# Patient Record
Sex: Male | Born: 1975 | Race: White | Hispanic: No | Marital: Single | Smoking: Current every day smoker
Health system: Southern US, Community
[De-identification: ages and names within clinical notes are randomized; demographics above are authoritative.]

## PROBLEM LIST (undated history)

## (undated) DIAGNOSIS — F1910 Other psychoactive substance abuse, uncomplicated: Secondary | ICD-10-CM

---

## 2019-08-03 ENCOUNTER — Emergency Department (HOSPITAL_COMMUNITY)
Admission: EM | Admit: 2019-08-03 | Discharge: 2019-08-03 | Disposition: A | Discharge status: Home / Self Care | Payer: Self-pay | Attending: Emergency Medicine | Admitting: Emergency Medicine

## 2019-08-03 ENCOUNTER — Other Ambulatory Visit: Payer: Self-pay

## 2019-08-03 ENCOUNTER — Encounter (HOSPITAL_COMMUNITY): Payer: Self-pay | Admitting: Family Medicine

## 2019-08-03 DIAGNOSIS — F1510 Other stimulant abuse, uncomplicated: Secondary | ICD-10-CM

## 2019-08-03 DIAGNOSIS — F1220 Cannabis dependence, uncomplicated: Secondary | ICD-10-CM | POA: Insufficient documentation

## 2019-08-03 DIAGNOSIS — F32A Depression, unspecified: Secondary | ICD-10-CM

## 2019-08-03 DIAGNOSIS — Z8659 Personal history of other mental and behavioral disorders: Secondary | ICD-10-CM

## 2019-08-03 DIAGNOSIS — F1520 Other stimulant dependence, uncomplicated: Secondary | ICD-10-CM

## 2019-08-03 DIAGNOSIS — F329 Major depressive disorder, single episode, unspecified: Secondary | ICD-10-CM

## 2019-08-03 LAB — CBC
HCT: 53.5 % — ABNORMAL HIGH (ref 39.0–52.0)
Hemoglobin: 17.7 g/dL — ABNORMAL HIGH (ref 13.0–17.0)
MCH: 28.5 pg (ref 26.0–34.0)
MCHC: 33.1 g/dL (ref 30.0–36.0)
MCV: 86.2 fL (ref 80.0–100.0)
Platelets: 317 10*3/uL (ref 150–400)
RBC: 6.21 MIL/uL — ABNORMAL HIGH (ref 4.22–5.81)
RDW: 13.2 % (ref 11.5–15.5)
WBC: 9.8 10*3/uL (ref 4.0–10.5)
nRBC: 0 % (ref 0.0–0.2)

## 2019-08-03 LAB — RAPID URINE DRUG SCREEN, HOSP PERFORMED
Amphetamines: POSITIVE — AB
Barbiturates: NOT DETECTED
Benzodiazepines: NOT DETECTED
Cocaine: NOT DETECTED
Opiates: NOT DETECTED
Tetrahydrocannabinol: POSITIVE — AB

## 2019-08-03 LAB — COMPREHENSIVE METABOLIC PANEL
ALT: 40 U/L (ref 0–44)
AST: 28 U/L (ref 15–41)
Albumin: 5.4 g/dL — ABNORMAL HIGH (ref 3.5–5.0)
Alkaline Phosphatase: 66 U/L (ref 38–126)
Anion gap: 15 (ref 5–15)
BUN: 11 mg/dL (ref 6–20)
CO2: 23 mmol/L (ref 22–32)
Calcium: 10.4 mg/dL — ABNORMAL HIGH (ref 8.9–10.3)
Chloride: 100 mmol/L (ref 98–111)
Creatinine, Ser: 1.29 mg/dL — ABNORMAL HIGH (ref 0.61–1.24)
GFR calc Af Amer: >60 mL/min (ref >60)
GFR calc non Af Amer: >60 mL/min (ref >60)
Glucose, Bld: 100 mg/dL — ABNORMAL HIGH (ref 70–99)
Potassium: 3.9 mmol/L (ref 3.5–5.1)
Sodium: 138 mmol/L (ref 135–145)
Total Bilirubin: 1.4 mg/dL — ABNORMAL HIGH (ref 0.3–1.2)
Total Protein: 8.3 g/dL — ABNORMAL HIGH (ref 6.5–8.1)

## 2019-08-03 LAB — SALICYLATE LEVEL: Salicylate Lvl: <7 mg/dL (ref 2.8–30.0)

## 2019-08-03 LAB — ETHANOL: Alcohol, Ethyl (B): <10 mg/dL (ref <10)

## 2019-08-03 LAB — ACETAMINOPHEN LEVEL: Acetaminophen (Tylenol), Serum: <10 ug/mL — ABNORMAL LOW (ref 10–30)

## 2019-08-03 NOTE — ED Notes (Signed)
Pt's significant other is in the room with pt. Per previous shift, Dr Maudie Mercury wants pt's girlfriend to stay in the room with pt the entire time to keep him calm. Charge nurse is aware.

## 2019-08-03 NOTE — ED Triage Notes (Signed)
Patient states he is here to "get off of meth". Patient states he has been using for years and last time he smoked methamphetamine was yesterday. Patient denies pain. Patient breathing very rapidly. States he thinks his girlfriend is going to leave him and he "always gets like this" when he thinks she will leave him. Patient states he has had thoughts of harming himself in past two weeks when his girlfriend left him. No specific plan in place.

## 2019-08-03 NOTE — ED Provider Notes (Signed)
[facility]-EMERGENCY DEPT Provider Note   CSN: [id number] Arrival date & time: 08/03/19  1750     History   Chief Complaint Chief Complaint  Patient presents with  . Withdrawal    HPI Daniel Hutchinson is a 43 y.o. male with past medical hx s/f long term methamphetamine use (22 years), who presents to the ED as directed by [facility] emergency services.  Patient reports that he has never fully withdrawn from methamphetamines.  He is only felt acute withdrawal effects at which point he will use again.  Patient has many psychosocial stressors at this time, including, fear of his girlfriend leaving him.  His girlfriend is at bedside.  He has expressed suicidal ideation in the past and fear of girlfriend leaving him.  He does not currently have SI, HI, AVS.  Allergies & Medications Patient has no known allergies. Prior to Admission medications   Not on File    Past Medical/Surgical History History reviewed. No pertinent past medical history. Patient Active Problem List   Diagnosis Date Noted  . 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 Depression 08/03/2019  . History of suicidal ideation 08/03/2019  . Methamphetamine dependence, continuous (HCC) 08/03/2019   History reviewed. No pertinent surgical history.   Family History History reviewed. No pertinent family history.  Social History Social History   Tobacco Use  . Smoking status: Current Every Day Smoker    Types: Cigarettes  . Smokeless tobacco: Never Used  Substance Use Topics  . Alcohol use: Not Currently  . Drug use: Yes    Types: IV, Methamphetamines    Review of Systems Review of Systems  Constitutional: Positive for diaphoresis. Negative for chills and fever.  HENT: Negative for congestion, ear pain, sneezing and sore throat.   Eyes: Negative for pain and visual disturbance.  Respiratory: Negative for cough and shortness of breath.   Cardiovascular: Negative for chest pain and  palpitations.  Gastrointestinal: Negative for abdominal pain and vomiting.  Endocrine: Positive for heat intolerance.  Genitourinary: Negative for dysuria and hematuria.  Musculoskeletal: Negative for arthralgias and back pain.  Skin: Negative for color change and rash.  Neurological: Negative for seizures and syncope.  Psychiatric/Behavioral: Positive for suicidal ideas. The patient is nervous/anxious.   All other systems reviewed and are negative.   Physical Exam Updated Vital Signs BP (!) 139/105 (BP Location: Left Arm)   Pulse 100   Temp 97.6 F (36.4 C) (Oral)   Resp (!) 100   Ht 6' (1.829 m)   Wt 90.7 kg   SpO2 100%   BMI 27.12 kg/m   Physical Exam Vitals signs and nursing note reviewed.  Constitutional:      Appearance: He is well-developed.  HENT:     Head: Normocephalic and atraumatic.  Eyes:     Conjunctiva/sclera: Conjunctivae normal.  Neck:     Musculoskeletal: Normal range of motion and neck supple.  Cardiovascular:     Rate and Rhythm: Normal rate and regular rhythm.     Heart sounds: No murmur.  Pulmonary:     Effort: Pulmonary effort is normal. No respiratory distress.     Breath sounds: Normal breath sounds.  Abdominal:     Palpations: Abdomen is soft.     Tenderness: There is no abdominal tenderness.  Skin:    General: Skin is warm and dry.     Capillary Refill: Capillary refill takes less than 2 seconds.  Neurological:     Mental Status: He is alert.  Psychiatric:        Attention and Perception: Attention and perception normal. He does not perceive auditory or visual hallucinations.        Mood and Affect: Mood is anxious. Affect is tearful.        Speech: Speech normal.        Behavior: Behavior is cooperative.        Thought Content: Thought content does not include suicidal ideation.        Cognition and Memory: Cognition and memory normal.     ED Treatments / Results  Labs (all labs ordered are listed, but only abnormal results are  displayed) Labs Reviewed  COMPREHENSIVE METABOLIC PANEL - Abnormal; Notable for the following components:      Result Value   Glucose, Bld 100 (*)    Creatinine, Ser 1.29 (*)    Calcium 10.4 (*)    Total Protein 8.3 (*)    Albumin 5.4 (*)    Total Bilirubin 1.4 (*)    All other components within normal limits  ACETAMINOPHEN LEVEL - Abnormal; Notable for the following components:   Acetaminophen (Tylenol), Serum <10 (*)    All other components within normal limits  CBC - Abnormal; Notable for the following components:   RBC 6.21 (*)    Hemoglobin 17.7 (*)    HCT 53.5 (*)    All other components within normal limits  RAPID URINE DRUG SCREEN, HOSP PERFORMED - Abnormal; Notable for the following components:   Amphetamines POSITIVE (*)    Tetrahydrocannabinol POSITIVE (*)    All other components within normal limits  ETHANOL  SALICYLATE LEVEL    EKG None  Radiology No results found.  Procedures Procedures (including critical care time)  Medications Ordered in ED Medications - No data to display  Initial Impression / Assessment and Plan / ED Course  I have reviewed the triage vital signs and the nursing notes. Pertinent labs & imaging results that were available during my care of the patient were reviewed by me and considered in my medical decision making (see chart for details). Patient was placed in TCU as there were some reports of patient being suicidal.  Patient denies being actively suicidal at this time but does want to detox from methamphetamines.  He does not want to receive any benzodiazepines, opiates, or any other addicting substances. Unclear disposition at this time as methamphetamine withdrawal does not require hospitalization typically and patient does not have active suicidal ideation.  However, with withdrawal, patient is at high risk for depression, anxiety, SI, which patient's girlfriend endorses that he has done in the past.  Patient had an episode when he  thought the girlfriend was not going to be able to stay in the room with him, he hyperventilated, became tachycardic per nursing.  Will consult psych for recommendations on further management and dispo.  Continue to monitor patient and vital signs routinely.  Patient seen by behavioral health and they recommend peer support consult prior to discharge.  He is currently not actively suicidal and does not meet any requirements for behavioral health admission.   Provided patient with information about substance abuse programs in the area.       Final Clinical Impressions(s) / ED Diagnoses   Final diagnoses:  Methamphetamine abuse (HCC)  Methamphetamine dependence, continuous (HCC)  History of suicidal ideation    Disposition: Home  ED Discharge Orders    None     Melene Planachel E. Berish Bohman, M.D. FM PGY-2  Wilber Oliphant, MD 08/03/19 2213    Margette Fast, MD 08/04/19 1141
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
## 2019-08-03 NOTE — ED Notes (Signed)
Pt aware urine sample is needed and pt needs to be changed into scrubs.

## 2019-08-03 NOTE — Discharge Instructions (Addendum)
We have called a referral for peer support program to help you through quitting methamphetamines.  Please come back if you need anything at all or if you are experiencing any further suicidal ideation.  I wish you the best and I am glad that you have decided to come off of methamphetamines.  Take care.  Substance Abuse Treatment Programs  Intensive Outpatient Programs [facility]     [address]      [place], [place]                   [phone]       [facility] [address] [place] [address] [place], [place] [phone]  [facility]     (Inpatient and outpatient)     [address]           [phone]    [facility] [phone] (Suboxone and Methadone)  [address]      [place], [place] [address]      [phone]       [address] [place], [place] [phone]  [facility] (Outpatient/Inpatient, [employer])    (insurance only) [phone]             [facility] (Groups & Residential) [place], [place] [phone]     [facility]     [address]     [place], [place]      [phone]       [facility] (for caregivers and family) [phone] [address] [place], [place] [phone]      Residential Treatment Programs [facility]      [address] [place], [place] [address]  [phone]       [facility] [address], [place], [place] [address] [phone]  [facility] [place]        [phone]       [facility] [phone]  [facility] ([facility]             [address]                                         [place], [place]                                                [phone] or [phone]                               [facility] [place] [address] [place], [address] [phone]  [facility]    [address]      [place],  [place]     [phone]       [facility] [address] [place], [place] [phone]  [facility]   [address] [place], [place] [address]     [phone]      Admissions: 8am-3pm M-F  [facility] ([facility]) [address] [place], [place] [phone]  [facility]: Residential Program [phone] Days)   [place], [place]      [phone] or [phone]     [facility]: [facility]  [facility] [place], [place] (Walk in Hours over the weekend or by referral)  [facility] [place], [place], [place] [address] [phone]  Crisis Mobile: [facility]:  [phone] (for crisis response 24 hours a day) [facility] Hotline:      [phone] Outpatient Psychiatry and Counseling  [facility]: Mobile Crisis Management 24 hours:  [phone]  [facility] [employer] sliding scale fee and walk in schedule: M-F 8am-12pm/1pm-3pm [place], [place] [address] [place], [place] [address] [phone]  [facility] [place] ([facility] [employer])- new patient walk-in appointments available Monday - Friday 8am -3pm.          [address] [place], [place] [address] [phone] or crisis line- [facility]/ Intensive Outpatient Therapy Program [place], [place] [address] [place]      [phone] [place], [place] [address]                 [place]   [facility] [phone]. [place], [place] [address]   [employer] [facility]          [address] Johnette Abraham  [place], [place] [address]       [phone]  [facility] [address], [place], [place] [address] [phone]  [facility]    [address] [place], [place] [address]     [place], [place] Joycelyn Man     [place] [address]     [phone]       [facility] [place] [address]  [facility]     [address], [place], [place], [place] [address] [place]     [address]     [place], [place] [address]     [phone]       [facility] [address] [place], [place] [address] [phone] Resources for Temporary Residential Assistance/Crisis [facility]) M-F 8am-3pm   [address], [place] [address]   [phone] Services include: laundry, barbering, support groups, case management, phone  & computer access, showers, AA/NA mtgs, mental health/substance abuse nurse, job skills class, disability information, VA assistance, spiritual classes, etc.   [facility]   [address] [place]     [place]              [employer] (women and children)       [place] [place], [place] [address] [phone] [email or web address] for application and process Application Required  [facility]   [address]    [place]  [address]     [phone]  [place], [place] [address] [phone](RXYVOPFY application appt.) Application Required  [facility] (women only)    [address]     [place], [place] [address]     [phone]      Intake starts 6pm daily Need valid ID, SSC, & Police report [employer] [address] [place], [place] [phone] Application Required  [employer] (men only)     [place].      [place], [place]       [place] (Pregnant women only) [address] [place], [place]  [facility]      [facility] [place] [address]i [address].      [place], [place] [address]     [phone]             [facility] [address] [place], [place] 90 day commitment/SA/Application process  [facility](men only)     [address]     [place], [place]       Check-in at [facility] of [facility] [address] [place], [place] [address] [phone] Men/Women/Women and Children must be there by 7 pm  [place], [place]

## 2019-08-06 ENCOUNTER — Telehealth (HOSPITAL_COMMUNITY): Payer: Self-pay | Admitting: Psychiatry

## 2019-08-06 NOTE — Telephone Encounter (Signed)
D:  Pt phoned, stating he was in the ED recently.  "I want to get off Meth." Reports he was told that someone would be calling him re: resources. A:  Will inform Aaron Edelman D/John (peer support) to give pt a call [phone]).  Pt is requesting to be called after 4:15 pm today.  R:  Pt receptive.

## 2019-08-28 ENCOUNTER — Emergency Department (HOSPITAL_COMMUNITY)
Admission: EM | Admit: 2019-08-28 | Discharge: 2019-08-29 | Disposition: A | Discharge status: Other Acute Inpatient Hospital | Payer: Medicaid Other | Attending: Emergency Medicine | Admitting: Emergency Medicine

## 2019-08-28 ENCOUNTER — Other Ambulatory Visit: Payer: Self-pay

## 2019-08-28 DIAGNOSIS — F1524 Other stimulant dependence with stimulant-induced mood disorder: Secondary | ICD-10-CM | POA: Insufficient documentation

## 2019-08-28 DIAGNOSIS — F332 Major depressive disorder, recurrent severe without psychotic features: Secondary | ICD-10-CM | POA: Insufficient documentation

## 2019-08-28 DIAGNOSIS — R45851 Suicidal ideations: Secondary | ICD-10-CM | POA: Insufficient documentation

## 2019-08-28 DIAGNOSIS — F17210 Nicotine dependence, cigarettes, uncomplicated: Secondary | ICD-10-CM | POA: Insufficient documentation

## 2019-08-28 DIAGNOSIS — Z20828 Contact with and (suspected) exposure to other viral communicable diseases: Secondary | ICD-10-CM | POA: Insufficient documentation

## 2019-08-28 LAB — COMPREHENSIVE METABOLIC PANEL
ALT: 37 U/L (ref 0–44)
AST: 31 U/L (ref 15–41)
Albumin: 4.1 g/dL (ref 3.5–5.0)
Alkaline Phosphatase: 55 U/L (ref 38–126)
Anion gap: 9 (ref 5–15)
BUN: 14 mg/dL (ref 6–20)
CO2: 25 mmol/L (ref 22–32)
Calcium: 9.1 mg/dL (ref 8.9–10.3)
Chloride: 105 mmol/L (ref 98–111)
Creatinine, Ser: 1.19 mg/dL (ref 0.61–1.24)
GFR calc Af Amer: >60 mL/min (ref >60)
GFR calc non Af Amer: >60 mL/min (ref >60)
Glucose, Bld: 102 mg/dL — ABNORMAL HIGH (ref 70–99)
Potassium: 4.2 mmol/L (ref 3.5–5.1)
Sodium: 139 mmol/L (ref 135–145)
Total Bilirubin: 0.6 mg/dL (ref 0.3–1.2)
Total Protein: 6.9 g/dL (ref 6.5–8.1)

## 2019-08-28 LAB — CBC
HCT: 48.5 % (ref 39.0–52.0)
Hemoglobin: 15.4 g/dL (ref 13.0–17.0)
MCH: 28.4 pg (ref 26.0–34.0)
MCHC: 31.8 g/dL (ref 30.0–36.0)
MCV: 89.3 fL (ref 80.0–100.0)
Platelets: 235 10*3/uL (ref 150–400)
RBC: 5.43 MIL/uL (ref 4.22–5.81)
RDW: 12.9 % (ref 11.5–15.5)
WBC: 8.8 10*3/uL (ref 4.0–10.5)
nRBC: 0 % (ref 0.0–0.2)

## 2019-08-28 LAB — RAPID URINE DRUG SCREEN, HOSP PERFORMED
Amphetamines: POSITIVE — AB
Barbiturates: NOT DETECTED
Benzodiazepines: NOT DETECTED
Cocaine: NOT DETECTED
Opiates: NOT DETECTED
Tetrahydrocannabinol: POSITIVE — AB

## 2019-08-28 LAB — ETHANOL: Alcohol, Ethyl (B): <10 mg/dL (ref <10)

## 2019-08-28 LAB — SALICYLATE LEVEL: Salicylate Lvl: <7 mg/dL (ref 2.8–30.0)

## 2019-08-28 LAB — ACETAMINOPHEN LEVEL: Acetaminophen (Tylenol), Serum: <10 ug/mL — ABNORMAL LOW (ref 10–30)

## 2019-08-28 NOTE — ED Notes (Signed)
Pt labs have been drawn, belongings removed, placed in scrubs, pt made aware of plan of care.
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
## 2019-08-28 NOTE — BH Assessment (Signed)
Tele Assessment Note   Patient Name: Daniel GongCharles Wade Hutchinson MRN: [id number] Referring Physician: Dr. Lorre NickAnthony Allen, MD Location of Patient: [facility] Location of Provider: Behavioral Health TTS Department  Erenest RasherCharles Wade Florencia ReasonsChilton is a 43 y.o. male who walked to [facility] to be seen due to ongoing methamphetamine abuse and due to ongoing SI. Pt states that he has been abusing methamphetamine for 22 years but stated the SI is new since he and his girlfriend broke up, he lost his job, he had to sell his truck before it was repossessed, and he is now homeless. Pt states that all of these circumstances have left him feeling overwhelmed and like there is no point to him living anymore. Pt states he went to a hardware store and purchased the supplies necessary to connect a hose to the tailpipe of his truck (before he sold it) and put the other end of the hose into the cab of the truck so he could kill himself via carbon monoxide poisoning. Pt states he has never attempted to kill himself in the past, nor has he ever previously planned to kill himself in the past, so this has been extremely scary to him. Pt was tearful throughout the assessment.  Pt states he has had thoughts of harming his brothers, as they have bullied him since he was a child, and his oldest brother, who is 5 years older than him, began introducing him to drugs when he was in 7th grade. Pt states it is his brother's fault that he is currently homeless, as his brother kicked him out of the home he was renting and then out from living with their mother. Pt denies AVH (other than when he's been up for several days), NSSIB, access to guns/weapons, and engagement in the legal system. Pt states he currently uses 1 gram of methamphetamine daily and that he also smokes marijuana regularly.  Pt provided verbal consent for clinician to contact his ex-girlfriend, Daniel Hutchinson, for collateral information.   Pt was oriented x4. His recent and  remote memory was intact. Pt was cooperative throughout the assessment process. Pt's insight, judgement, and impulse control is poor at this time.   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  No past surgical history on file.  Family History: No family history on file.  Social History:  reports that he has been smoking cigarettes. He has never used smokeless tobacco. He reports previous alcohol use. He reports current drug use. Drugs: IV and Methamphetamines.  Additional Social History:  Alcohol / Drug Use Pain Medications: Please see MAR Prescriptions: Please see MAR Over the Counter: Please see MAR History of alcohol / drug use?: Yes Longest period of sobriety (when/how long): 6 months Substance #1 Name of Substance 1: Methamphetamine 1 - Age of First Use: 21 1 - Amount (size/oz): 1 gram 1 - Frequency: Daily 1 - Duration: 22 years 1 - Last Use / Amount: Today Substance #2 Name of Substance 2: Marijuana 2 - Age of First Use: 7th grade 2 - Amount (size/oz): Unknown 2 - Frequency: Unknown 2 - Duration: Unknown 2 - Last Use / Amount: Unknown  CIWA: CIWA-Ar BP: (!) 146/80 Pulse Rate: 89 COWS:    Allergies: No Known Allergies  Home Medications: (Not in a hospital admission)   OB/GYN Status:  No LMP for male patient.  General Assessment Data  Assessment unable to be completed: Yes Reason for not completing assessment: multiple assessments at once Location of Assessment: [facility] ED TTS Assessment: In system Is this a Tele or Face-to-Face Assessment?: Tele Assessment Is this an Initial Assessment or a Re-assessment for this encounter?: Initial Assessment Patient Accompanied by:: N/A Permission Given to speak with another: Yes Name, Relationship and Phone Number: Daniel Hutchinson, ex-girlfriend:  [phone] Language Other than English: No Living Arrangements: Homeless/Shelter What gender do you identify as?: Male Marital status: Single Living Arrangements: Alone Can pt return to current living arrangement?: Yes Admission Status: Voluntary Is patient capable of signing voluntary admission?: Yes Referral Source: Self/Family/Friend Insurance type: None     Crisis Care Plan Living Arrangements: Alone Legal Guardian: Other:(Self) Name of Psychiatrist: None Name of Therapist: None  Education Status Is patient currently in school?: No Is the patient employed, unemployed or receiving disability?: Unemployed  Risk to self with the past 6 months Suicidal Ideation: Yes-Currently Present Has patient been a risk to self within the past 6 months prior to admission? : Yes Suicidal Intent: Yes-Currently Present Has patient had any suicidal intent within the past 6 months prior to admission? : No Is patient at risk for suicide?: Yes Suicidal Plan?: Yes-Currently Present Has patient had any suicidal plan within the past 6 months prior to admission? : No Specify Current Suicidal Plan: Pt bought supplies to run a tube from his exhaust pipe to the cab of his truck Access to Means: Yes Specify Access to Suicidal Means: Pt bought the supplies to complete this task What has been your use of drugs/alcohol within the last 12 months?: Pt acknowledges the use of marijuana and methamphetamine Previous Attempts/Gestures: No How many times?: 0 Other Self Harm Risks: Pt is homeless, addicted to methamphetamine, has limited supports Triggers for Past Attempts: None known Intentional Self Injurious Behavior: None Family Suicide History: No Recent stressful life event(s): Loss (Comment), Conflict (Comment), Financial Problems, Job Loss(Conflict w/ family, he and partner broke up) Persecutory voices/beliefs?: No Depression: Yes Depression Symptoms: Despondent, Tearfulness, Guilt, Feeling  worthless/self pity, Fatigue, Insomnia Substance abuse history and/or treatment for substance abuse?: Yes Suicide prevention information given to non-admitted patients: Not applicable  Risk to Others within the past 6 months Homicidal Ideation: Yes-Currently Present Does patient have any lifetime risk of violence toward others beyond the six months prior to admission? : No Thoughts of Harm to Others: Yes-Currently Present Comment - Thoughts of Harm to Others: Pt has thoughts of harming his brothers, as they are bullies Current Homicidal Intent: No Current Homicidal Plan: No Access to Homicidal Means: No Identified Victim: Pt's brothers History of harm to others?: No Assessment of Violence: None Noted Violent Behavior Description: N/A Does patient have access to weapons?: No(Pt denies access to guns/weapons) Criminal Charges Pending?: No Does patient have a court date: No Is patient on probation?: No  Psychosis Hallucinations: Auditory, Visual(Pt experiences AVH after not sleeping for 3 days) Delusions: None noted  Mental Status Report Appearance/Hygiene: In scrubs Eye Contact: Good Motor Activity: Freedom of movement, Other (Comment)(Pt is sitting in a hospital chair) Speech: Logical/coherent, Soft Level of Consciousness: Crying Mood: Depressed, Worthless, low self-esteem Affect: Depressed Anxiety Level: Minimal Thought Processes: Relevant Judgement: Partial Orientation: Person, Place, Time, Situation Obsessive Compulsive Thoughts/Behaviors: Minimal  Cognitive Functioning Concentration: Normal Memory: Recent Intact, Remote Intact Is patient IDD: No Insight: Fair Impulse Control: Poor Appetite: Good Have you had any weight changes? : No Change Sleep: Decreased Total Hours of Sleep: 2 Vegetative Symptoms:  None  ADLScreening [facility] Assessment Services) Patient's cognitive ability adequate to safely complete daily activities?: Yes Patient able to express need for  assistance with ADLs?: Yes Independently performs ADLs?: Yes (appropriate for developmental age)  Prior Inpatient Therapy Prior Inpatient Therapy: No  Prior Outpatient Therapy Prior Outpatient Therapy: Yes Prior Therapy Dates: 2000 Prior Therapy Facilty/Provider(s): Facility in [place] Reason for Treatment: SA Does patient have an ACCT team?: No Does patient have Intensive In-House Services?  : No Does patient have [facility] services? : No Does patient have P4CC services?: No  ADL Screening (condition at time of admission) Patient's cognitive ability adequate to safely complete daily activities?: Yes Is the patient deaf or have difficulty hearing?: No Does the patient have difficulty seeing, even when wearing glasses/contacts?: No Does the patient have difficulty concentrating, remembering, or making decisions?: No Patient able to express need for assistance with ADLs?: Yes Does the patient have difficulty dressing or bathing?: No Independently performs ADLs?: Yes (appropriate for developmental age) Does the patient have difficulty walking or climbing stairs?: No Weakness of Legs: None Weakness of Arms/Hands: None  Home Assistive Devices/Equipment Home Assistive Devices/Equipment: None  Therapy Consults (therapy consults require a physician order) PT Evaluation Needed: No OT Evalulation Needed: No SLP Evaluation Needed: No Abuse/Neglect Assessment (Assessment to be complete while patient is alone) Abuse/Neglect Assessment Can Be Completed: Unable to assess, patient is non-responsive or altered mental status Values / Beliefs Cultural Requests During Hospitalization: None Spiritual Requests During Hospitalization: None Consults Spiritual Care Consult Needed: No Social Work Consult Needed: No Merchant navy officer (For Healthcare) Does Patient Have a Medical Advance Directive?: No Would patient like information on creating a medical advance directive?: No - Patient declined          Disposition: Renaye Rakers, NP, reviewed pt's chart and information and determined pt meets criteria for inpatient hospitalization. Pt has been accepted to [facility] for tomorrow (08/29/2019) morning. Clinician attempted to contact pt's nurse but there was no answer.   Disposition Initial Assessment Completed for this Encounter: Yes Patient referred to: Other (Comment)(Pt has been accepted for tomorrow at [facility] Room 302)  This service was provided via telemedicine using a 2-way, interactive audio and video technology.  Names of all persons participating in this telemedicine service and their role in this encounter. Name: Sandi Mealy Role: Patient  Name: Renaye Rakers Role: Nurse Practitioner  Name: Duard Brady Role: Clinician    Ralph Dowdy 08/28/2019 10:05 PM

## 2019-08-28 NOTE — ED Provider Notes (Signed)
[facility]-EMERGENCY DEPT Provider Note   CSN: [id number] Arrival date & time: 08/28/19  1645     History   Chief Complaint Chief Complaint  Patient presents with  . Suicidal    HPI Daniel Hutchinson is a 43 y.o. male.     Patient states that he is very depressed and wants to kill himself.  He also used meths  The history is provided by the patient. No language interpreter was used.  Altered Mental Status Presenting symptoms: behavior changes   Severity:  Moderate Most recent episode:  Yesterday Episode history:  Continuous Timing:  Constant Progression:  Worsening Chronicity:  New Context: not alcohol use   Associated symptoms: no abdominal pain, no hallucinations, no headaches, no rash and no seizures     No past medical history on file.  Patient Active Problem List   Diagnosis Date Noted  . 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 Depression 08/03/2019  . History of suicidal ideation 08/03/2019  . Methamphetamine dependence, continuous (HCC) 08/03/2019    No past surgical history on file.      Home Medications    Prior to Admission medications   Not on File    Family History No family history on file.  Social History Social History   Tobacco Use  . Smoking status: Current Every Day Smoker    Types: Cigarettes  . Smokeless tobacco: Never Used  Substance Use Topics  . Alcohol use: Not Currently  . Drug use: Yes    Types: IV, Methamphetamines     Allergies   Patient has no known allergies.   Review of Systems Review of Systems  Constitutional: Negative for appetite change and fatigue.  HENT: Negative for congestion, ear discharge and sinus pressure.   Eyes: Negative for discharge.  Respiratory: Negative for cough.   Cardiovascular: Negative for chest pain.  Gastrointestinal: Negative for abdominal pain and diarrhea.  Genitourinary: Negative for frequency and hematuria.  Musculoskeletal: Negative for back  pain.  Skin: Negative for rash.  Neurological: Negative for seizures and headaches.  Psychiatric/Behavioral: Negative for hallucinations.       Depressed and suicidal     Physical Exam Updated Vital Signs BP (!) 146/80 (BP Location: Left Arm)   Pulse 89   Temp 97.9 F (36.6 C) (Oral)   Resp 19   SpO2 100%   Physical Exam Vitals signs and nursing note reviewed.  Constitutional:      Appearance: He is well-developed.  HENT:     Head: Normocephalic.     Nose: Nose normal.  Eyes:     General: No scleral icterus.    Conjunctiva/sclera: Conjunctivae normal.  Neck:     Musculoskeletal: Neck supple.     Thyroid: No thyromegaly.  Cardiovascular:     Rate and Rhythm: Normal rate and regular rhythm.     Heart sounds: No murmur. No friction rub. No gallop.   Pulmonary:     Breath sounds: No stridor. No wheezing or rales.  Chest:     Chest wall: No tenderness.  Abdominal:     General: There is no distension.     Tenderness: There is no abdominal tenderness. There is no rebound.  Musculoskeletal: Normal range of motion.  Lymphadenopathy:     Cervical: No cervical adenopathy.  Skin:    Findings: No erythema or rash.  Neurological:     Mental Status: He is oriented to person, place, and time.     Motor: No abnormal muscle tone.  Coordination: Coordination normal.  Psychiatric:     Comments: Suicidal      ED Treatments / Results  Labs (all labs ordered are listed, but only abnormal results are displayed) Labs Reviewed  SARS CORONAVIRUS 2 (TAT 6-24 HRS)  COMPREHENSIVE METABOLIC PANEL  ETHANOL  SALICYLATE LEVEL  ACETAMINOPHEN LEVEL  CBC  RAPID URINE DRUG SCREEN, HOSP PERFORMED    EKG None  Radiology No results found.  Procedures Procedures (including critical care time)  Medications Ordered in ED Medications - No data to display   Initial Impression / Assessment and Plan / ED Course  I have reviewed the triage vital signs and the nursing notes.   Pertinent labs & imaging results that were available during my care of the patient were reviewed by me and considered in my medical decision making (see chart for details).        Patient is suicidal.  He will be seen by behavioral health and most likely be placed somewhere  Final Clinical Impressions(s) / ED Diagnoses   Final diagnoses:  None    ED Discharge Orders    None       Milton Ferguson, MD 08/28/19 1932

## 2019-08-29 ENCOUNTER — Inpatient Hospital Stay (HOSPITAL_COMMUNITY)
Admission: AD | Admit: 2019-08-29 | Discharge: 2019-09-02 | DRG: 897 | Disposition: A | Discharge status: Home / Self Care | Payer: Self-pay | Source: Intra-hospital | Attending: Psychiatry | Admitting: Psychiatry

## 2019-08-29 ENCOUNTER — Encounter (HOSPITAL_COMMUNITY): Payer: Self-pay

## 2019-08-29 ENCOUNTER — Other Ambulatory Visit: Payer: Self-pay

## 2019-08-29 DIAGNOSIS — F329 Major depressive disorder, single episode, unspecified: Secondary | ICD-10-CM | POA: Diagnosis present

## 2019-08-29 DIAGNOSIS — Z20828 Contact with and (suspected) exposure to other viral communicable diseases: Secondary | ICD-10-CM | POA: Diagnosis present

## 2019-08-29 DIAGNOSIS — R45851 Suicidal ideations: Secondary | ICD-10-CM | POA: Diagnosis present

## 2019-08-29 DIAGNOSIS — F17210 Nicotine dependence, cigarettes, uncomplicated: Secondary | ICD-10-CM | POA: Diagnosis present

## 2019-08-29 DIAGNOSIS — G4700 Insomnia, unspecified: Secondary | ICD-10-CM | POA: Diagnosis present

## 2019-08-29 DIAGNOSIS — F1520 Other stimulant dependence, uncomplicated: Principal | ICD-10-CM | POA: Diagnosis present

## 2019-08-29 LAB — SARS CORONAVIRUS 2 (TAT 6-24 HRS): SARS Coronavirus 2: NEGATIVE

## 2019-08-29 MED ORDER — TRAZODONE HCL 100 MG PO TABS
100.0000 mg | ORAL_TABLET | Freq: Once | ORAL | Status: DC
  Filled 2019-08-29: qty 1

## 2019-08-29 MED ORDER — ALUM & MAG HYDROXIDE-SIMETH 200-200-20 MG/5ML PO SUSP: 30.0000 mL | ORAL | Status: DC | PRN

## 2019-08-29 MED ORDER — HYDROXYZINE HCL 25 MG PO TABS
25.0000 mg | ORAL_TABLET | Freq: Once | ORAL | Status: DC
  Filled 2019-08-29 (×2): qty 1

## 2019-08-29 MED ORDER — HYDROXYZINE HCL 25 MG PO TABS
25.0000 mg | ORAL_TABLET | Freq: Three times a day (TID) | ORAL | Status: DC | PRN
  Administered 2019-08-29: 21:00:00 25 mg via ORAL
  Filled 2019-08-29: qty 1
  Filled 2019-08-29: qty 10

## 2019-08-29 MED ORDER — TRAZODONE HCL 50 MG PO TABS
50.0000 mg | ORAL_TABLET | Freq: Every evening | ORAL | Status: DC | PRN
  Administered 2019-08-29 – 2019-08-31 (×3): 50 mg via ORAL
  Filled 2019-08-29 (×3): qty 1
  Filled 2019-08-29: qty 7
  Filled 2019-08-29: qty 1

## 2019-08-29 MED ORDER — ACETAMINOPHEN 325 MG PO TABS
650.0000 mg | ORAL_TABLET | Freq: Four times a day (QID) | ORAL | Status: DC | PRN
  Administered 2019-08-29: 650 mg via ORAL
  Filled 2019-08-29: qty 2

## 2019-08-29 MED ORDER — NICOTINE 14 MG/24HR TD PT24
14.0000 mg | MEDICATED_PATCH | Freq: Every day | TRANSDERMAL | Status: DC
  Administered 2019-08-29: 14 mg via TRANSDERMAL
  Filled 2019-08-29: qty 1

## 2019-08-29 MED ORDER — MAGNESIUM HYDROXIDE 400 MG/5ML PO SUSP: 30.0000 mL | Freq: Every day | ORAL | Status: DC | PRN

## 2019-08-29 NOTE — Progress Notes (Signed)
   08/29/19 2300  Psych Admission Type (Psych Patients Only)  Admission Status Voluntary  Psychosocial Assessment  Patient Complaints Agitation;Anxiety;Restlessness;Tension;Worrying  Eye Contact Brief  Facial Expression Animated;Anxious;Flat  Affect Anxious;Depressed;Labile;Preoccupied  Speech Logical/coherent  Interaction Assertive  Motor Activity Pacing;Restless  Appearance/Hygiene Unremarkable  Behavior Characteristics Agitated;Anxious;Restless;Other (Comment) (tearful)  Mood Depressed;Anxious;Preoccupied;Sad  Thought Process  Coherency WDL  Content WDL  Delusions None reported or observed  Perception WDL  Hallucination None reported or observed  Judgment Impaired  Confusion None  Danger to Self  Current suicidal ideation? Denies  Danger to Others  Danger to Others None reported or observed   Pt. Became anxious and tearful this evening, stated "I'm not feeling well".  Pt. Visibly upset when he attempted to call his girlfriend and she would not speak with him.  Pt. Spoke with this Probation officer and Ben, MHT and support was given. He was medicated for his symptoms and remained safe on the unit.

## 2019-08-29 NOTE — Progress Notes (Signed)
Progress note  Called [phone] at 1230 for report. No answer. Will try again later.

## 2019-08-29 NOTE — Progress Notes (Signed)
Patient ID: Daniel Hutchinson, male   DOB: 1976/10/01, 43 y.o.   MRN: [id number] Admission Note  Pt is  43 yo male that presents voluntarily on 08/29/2019 with worsening si, depression, anxiety, substance use, housing issues, and financial strain. Pt states they were working a month ago and fell from a ladder. Pt was being paid under the table and was fired while in the emergency department. Pt was unable to file for supplementation since they were undocumented. Pt had taken out a loan on their truck to pay for their girlfriends car. Pt was unable to pay the loan off, had to sell their truck, and lost their home. Pt still has the car with the girlfriend. Pt also states they have been using meth for the last 20 years daily, "a gram a day". Pt endorses cannabis use. Pt drinks alcohol occ. Pt is a 2 ppd smoker. Pt endorses past verbal abuse. Pt denies past/present sexual/physical abuse. Pt denies self neglect. Pt denies a pcp or medication hx. Pt denies an influenza/pneumonia vaccine. Pt had a plan with supplies to kill themself. Pt states they have avh when using drugs and being awake for days. Pt denies current si/hi/ah/vh and verbally agrees to approach staff if these become apparent or before harming themself/others while at [facility] signed, skin/belongings search completed and patient oriented to unit. Patient stable at this time. Patient given the opportunity to express concerns and ask questions. Patient given toiletries. Will continue to monitor.   Per TTS:  Daniel Hutchinson is a 43 y.o. male who walked to [facility] ED to be seen due to ongoing methamphetamine abuse and due to ongoing SI. Pt states that he has been abusing methamphetamine for 22 years but stated the SI is new since he and his girlfriend broke up, he lost his job, he had to sell his truck before it was repossessed, and he is now homeless. Pt states that all of these circumstances have left him feeling overwhelmed and like there  is no point to him living anymore. Pt states he went to a hardware store and purchased the supplies necessary to connect a hose to the tailpipe of his truck (before he sold it) and put the other end of the hose into the cab of the truck so he could kill himself via carbon monoxide poisoning. Pt states he has never attempted to kill himself in the past, nor has he ever previously planned to kill himself in the past, so this has been extremely scary to him. Pt was tearful throughout the assessment.  Pt states he has had thoughts of harming his brothers, as they have bullied him since he was a child, and his oldest brother, who is 66 years older than him, began introducing him to drugs when he was in 7th grade. Pt states it is his brother's fault that he is currently homeless, as his brother kicked him out of the home he was renting and then out from living with their mother. Pt denies AVH (other than when he's been up for several days), NSSIB, access to guns/weapons, and engagement in the legal system. Pt states he currently uses 1 gram of methamphetamine daily and that he also smokes marijuana regularly.

## 2019-08-29 NOTE — ED Notes (Signed)
Pt off unit to [facility]. Pt alert, calm, cooperative, no s/s of distress. DC information given to to [facility] for facility. Belongings given to [facility] [facility] for facility. Pt ambulatory off unit, escorted by MHT. Pt transported by [facility].

## 2019-08-29 NOTE — Progress Notes (Signed)
Patient shared in group that he was grateful to be here as opposed to being in the emergency department. The patient's event for the day is that he ate well. His goal for tomorrow is to begin to feel better.

## 2019-08-29 NOTE — Tx Team (Signed)
Initial Treatment Plan 08/29/2019 4:48 PM Daniel Hutchinson PHX:[id number]    PATIENT STRESSORS: Financial difficulties Occupational concerns Substance abuse   PATIENT STRENGTHS: Ability for insight Average or above average intelligence Capable of independent living Communication skills Supportive family/friends Work skills   PATIENT IDENTIFIED PROBLEMS: SI  depression  anxiety  Substance abuse  Housing issues             DISCHARGE CRITERIA:  Ability to meet basic life and health needs Adequate post-discharge living arrangements Medical problems require only outpatient monitoring Motivation to continue treatment in a less acute level of care  PRELIMINARY DISCHARGE PLAN: Attend aftercare/continuing care group Outpatient therapy Placement in alternative living arrangements  PATIENT/FAMILY INVOLVEMENT: This treatment plan has been presented to and reviewed with the patient, Daniel Hutchinson.  The patient and family have been given the opportunity to ask questions and make suggestions.  Baron Sane, RN 08/29/2019, 4:48 PM

## 2019-08-29 NOTE — BH Assessment (Signed)
Verne Spurr - pt's ex-girlfriend: [phone]

## 2019-08-29 NOTE — BH Assessment (Signed)
[facility] Assessment Progress Note  Per Talbot Grumbling, NP, this pt requires psychiatric hospitalization at this time.  Kathalene Frames, RN, [facility] has assigned pt to [facility] Rm 306-1.  Pt has signed Voluntary Admission and Consent for Treatment, as well as Consent to Release Information to his fiancee, and signed forms have been faxed to [facility]  Pt's nurse, Eustaquio Maize, has been notified, and agrees to send original paperwork along with pt via [employer], and to call report to [phone].  Jalene Mullet, [facility] Coordinator [phone]

## 2019-08-29 NOTE — Progress Notes (Signed)
Received Daniel Hutchinson from the main ED with an escort. He was oriented to his new environment and immediately drifted off to sleep after requesting to see the doctor and a sleep aid. He continued to sleep throughout the night without incident.

## 2019-08-30 ENCOUNTER — Inpatient Hospital Stay (HOSPITAL_COMMUNITY): Payer: Self-pay

## 2019-08-30 DIAGNOSIS — R45851 Suicidal ideations: Secondary | ICD-10-CM

## 2019-08-30 DIAGNOSIS — F15288 Other stimulant dependence with other stimulant-induced disorder: Secondary | ICD-10-CM

## 2019-08-30 DIAGNOSIS — R52 Pain, unspecified: Secondary | ICD-10-CM

## 2019-08-30 DIAGNOSIS — R609 Edema, unspecified: Secondary | ICD-10-CM

## 2019-08-30 DIAGNOSIS — F332 Major depressive disorder, recurrent severe without psychotic features: Secondary | ICD-10-CM

## 2019-08-30 MED ORDER — MELOXICAM 7.5 MG PO TABS
7.5000 mg | ORAL_TABLET | Freq: Every day | ORAL | Status: DC
  Administered 2019-08-30 – 2019-09-01 (×3): 7.5 mg via ORAL
  Filled 2019-08-30 (×6): qty 1

## 2019-08-30 MED ORDER — MIRTAZAPINE 15 MG PO TABS
15.0000 mg | ORAL_TABLET | Freq: Every day | ORAL | Status: DC
  Administered 2019-08-30 – 2019-08-31 (×2): 15 mg via ORAL
  Filled 2019-08-30 (×4): qty 1
  Filled 2019-08-30: qty 7

## 2019-08-30 MED ORDER — NICOTINE 21 MG/24HR TD PT24
21.0000 mg | MEDICATED_PATCH | Freq: Every day | TRANSDERMAL | Status: DC
  Administered 2019-08-30 – 2019-09-01 (×3): 21 mg via TRANSDERMAL
  Filled 2019-08-30 (×5): qty 1

## 2019-08-30 NOTE — Tx Team (Signed)
Interdisciplinary Treatment and Diagnostic Plan Update  08/30/2019 Time of Session: 8:45am Daniel Hutchinson MRN: [id number]  Principal Diagnosis: <principal problem not specified>  Secondary Diagnoses: Active Problems:   MDD (major depressive disorder)   Current Medications:  Current Facility-Administered Medications  Medication Dose Route Frequency Provider Last Rate Last Dose  . acetaminophen (TYLENOL) tablet 650 mg  650 mg Oral Q6H PRN Anike, Adaku C, NP   650 mg at 08/29/19 2116  . alum & mag hydroxide-simeth (MAALOX/MYLANTA) 200-200-20 MG/5ML suspension 30 mL  30 mL Oral Q4H PRN Anike, Adaku C, NP      . hydrOXYzine (ATARAX/VISTARIL) tablet 25 mg  25 mg Oral TID PRN Anike, Adaku C, NP   25 mg at 08/29/19 2116  . hydrOXYzine (ATARAX/VISTARIL) tablet 25 mg  25 mg Oral Once Anike, Adaku C, NP      . magnesium hydroxide (MILK OF MAGNESIA) suspension 30 mL  30 mL Oral Daily PRN Anike, Adaku C, NP      . traZODone (DESYREL) tablet 100 mg  100 mg Oral Once Anike, Adaku C, NP      . traZODone (DESYREL) tablet 50 mg  50 mg Oral QHS PRN Anike, Adaku C, NP   50 mg at 08/29/19 2116   PTA Medications: No medications prior to admission.    Patient Stressors: Financial difficulties Occupational concerns Substance abuse  Patient Strengths: Ability for insight Average or above average intelligence Capable of independent living Communication skills Supportive family/friends Work skills  Treatment Modalities: Medication Management, Group therapy, Case management,  1 to 1 session with clinician, Psychoeducation, Recreational therapy.   Physician Treatment Plan for Primary Diagnosis: <principal problem not specified> Long Term Goal(s):     Short Term Goals:    Medication Management: Evaluate patient's response, side effects, and tolerance of medication regimen.  Therapeutic Interventions: 1 to 1 sessions, Unit Group sessions and Medication administration.  Evaluation of  Outcomes: Not Met  Physician Treatment Plan for Secondary Diagnosis: Active Problems:   MDD (major depressive disorder)  Long Term Goal(s):     Short Term Goals:       Medication Management: Evaluate patient's response, side effects, and tolerance of medication regimen.  Therapeutic Interventions: 1 to 1 sessions, Unit Group sessions and Medication administration.  Evaluation of Outcomes: Not Met   RN Treatment Plan for Primary Diagnosis: <principal problem not specified> Long Term Goal(s): Knowledge of disease and therapeutic regimen to maintain health will improve  Short Term Goals: Ability to participate in decision making will improve, Ability to disclose and discuss suicidal ideas and Ability to identify and develop effective coping behaviors will improve  Medication Management: RN will administer medications as ordered by provider, will assess and evaluate patient's response and provide education to patient for prescribed medication. RN will report any adverse and/or side effects to prescribing provider.  Therapeutic Interventions: 1 on 1 counseling sessions, Psychoeducation, Medication administration, Evaluate responses to treatment, Monitor vital signs and CBGs as ordered, Perform/monitor CIWA, COWS, AIMS and Fall Risk screenings as ordered, Perform wound care treatments as ordered.  Evaluation of Outcomes: Not Met   LCSW Treatment Plan for Primary Diagnosis: <principal problem not specified> Long Term Goal(s): Safe transition to appropriate next level of care at discharge, Engage patient in therapeutic group addressing interpersonal concerns.  Short Term Goals: Engage patient in aftercare planning with referrals and resources  Therapeutic Interventions: Assess for all discharge needs, 1 to 1 time with Social worker, Explore available resources and support systems, Assess for  adequacy in community support network, Educate family and significant other(s) on suicide prevention,  Complete Psychosocial Assessment, Interpersonal group therapy.  Evaluation of Outcomes: Not Met   Progress in Treatment: Attending groups: No. New to unit  Participating in groups: No. Taking medication as prescribed: Yes. Toleration medication: Yes. Family/Significant other contact made: No, will contact:  if patient consents to collateral contacts Patient understands diagnosis: Yes. Discussing patient identified problems/goals with staff: Yes. Medical problems stabilized or resolved: Yes. Denies suicidal/homicidal ideation: No. Issues/concerns per patient self-inventory: No. Other:    New problem(s) identified: None   New Short Term/Long Term Goal(s):Detox, medication stabilization, elimination of SI thoughts, development of comprehensive mental wellness plan.    Patient Goals:    Discharge Plan or Barriers: Patient recently admitted. CSW will continue to follow and assess for appropriate referrals and possible discharge planning.    Reason for Continuation of Hospitalization: Anxiety Depression Medication stabilization Suicidal ideation  Estimated Length of Stay: 3-5 days   Attendees: Patient:  08/30/2019 8:52 AM  Physician: Dr. Myles Lipps, MD 08/30/2019 8:52 AM  Nursing: Jan.W, RN 08/30/2019 8:52 AM  RN Care Manager: 08/30/2019 8:52 AM  Social Worker: Radonna Ricker, LCSW 08/30/2019 8:52 AM  Recreational Therapist:  08/30/2019 8:52 AM  Other:  08/30/2019 8:52 AM  Other:  08/30/2019 8:52 AM  Other: 08/30/2019 8:52 AM    Scribe for Treatment Team: [name], [place] 08/30/2019 8:52 AM

## 2019-08-30 NOTE — Progress Notes (Signed)
   08/30/19 1300  Psych Admission Type (Psych Patients Only)  Admission Status Voluntary  Psychosocial Assessment  Patient Complaints Agitation  Eye Contact Fair  Facial Expression Animated  Affect Anxious  Speech Logical/coherent  Interaction Assertive  Motor Activity Other (Comment) (wdl)  Appearance/Hygiene Unremarkable  Behavior Characteristics Anxious  Mood Anxious;Depressed  Thought Process  Coherency WDL  Content WDL  Delusions None reported or observed  Perception WDL  Hallucination None reported or observed  Judgment WDL  Confusion WDL  Danger to Self  Current suicidal ideation? Denies  Danger to Others  Danger to Others None reported or observed   Pt was agitated this morning and mood improved after he talked with MD and his GF on the phone. Pt went to [facility] for vascular studies. He ate lunch and is interacting on the unit. Pt denies si and hi.

## 2019-08-30 NOTE — H&P (Signed)
Psychiatric Admission Assessment Adult  Patient Identification: Daniel Hutchinson  MRN:  [id number]  Date of Evaluation:  08/30/2019  Chief Complaint:  MDD; Amphetamine Abuse  Principal Diagnosis: Methamphetamine dependence, continuous (HCC)  Diagnosis:  Principal Problem:   Methamphetamine dependence, continuous (HCC) Active Problems:   MDD (major depressive disorder)  History of Present Illness: (Per Md's admission SRA):   Patient is a 43 year old male with a past psychiatric history significant for methamphetamine dependence.  Patient presented to the [facility] on 08/28/2019 with suicidal ideation and ongoing methamphetamine dependence.  The patient had been using methamphetamines for approximately the last 20 years, but he has gotten involved with a woman who he loves, and she will not be with him unless he gets clean.  He stated that he had been doing well with her, written remaining clean and working, but then relapsed since he and his girlfriend broke up.  Unfortunately since then he is lost his job, had to sell his truck, and was at a homeless shelter but then left there a couple of days ago.  He feels overwhelmed and feels like killing himself. Review of the electronic medical record revealed at least 2-3 emergency room visits in the last several months secondary to his methamphetamine dependence.  He has not been in rehab or detox for many years, and denied any charges or court dates coming forward. Review of his laboratories revealed no alcohol, but drug screen positive for methamphetamines as well as marijuana.  He was admitted to the hospital for evaluation and stabilization.  Associated Signs/Symptoms:  Depression Symptoms:  depressed mood, insomnia, feelings of worthlessness/guilt, difficulty concentrating, anxiety,  (Hypo) Manic Symptoms:  Impulsivity, Irritable Mood, Labiality of Mood,  Anxiety Symptoms:  Excessive Worry,  Psychotic Symptoms:   Denies any hallucinations, delusions or paranoia  PTSD Symptoms: NA  Total Time spent with patient: 1 hour  Past Psychiatric History: Methamphetamine use disorder.  Is the patient at risk to self? Yes.    Has the patient been a risk to self in the past 6 months? Yes.    Has the patient been a risk to self within the distant past? Yes.    Is the patient a risk to others? No.  Has the patient been a risk to others in the past 6 months? No.  Has the patient been a risk to others within the distant past? No.  Prior Inpatient Therapy: Yes Prior Outpatient Therapy: Yes  Alcohol Screening: 1. How often do you have a drink containing alcohol?: Monthly or less 2. How many drinks containing alcohol do you have on a typical day when you are drinking?: 3 or 4 3. How often do you have six or more drinks on one occasion?: Never AUDIT-C Score: 2 4. How often during the last year have you found that you were not able to stop drinking once you had started?: Never 5. How often during the last year have you failed to do what was normally expected from you becasue of drinking?: Never 6. How often during the last year have you needed a first drink in the morning to get yourself going after a heavy drinking session?: Never 7. How often during the last year have you had a feeling of guilt of remorse after drinking?: Never 8. How often during the last year have you been unable to remember what happened the night before because you had been drinking?: Never 9. Have you or someone else been injured as a result of  your drinking?: No 10. Has a relative or friend or a doctor or another health worker been concerned about your drinking or suggested you cut down?: No Alcohol Use Disorder Identification Test Final Score (AUDIT): 2  Substance Abuse History in the last 12 months:  Yes.    Consequences of Substance Abuse: Medical Consequences:  Liver damage, Possible death by overdose Legal Consequences:  Arrests,  jail time, Loss of driving privilege. Family Consequences:  Family discord, divorce and or separation.  Previous Psychotropic Medications: Yes   Psychological Evaluations: No   Past Medical History: History reviewed. No pertinent past medical history. History reviewed. No pertinent surgical history.  Family History: History reviewed. No pertinent family history.  Family Psychiatric  History: None reported.  Tobacco Screening:    Social History:  Social History   Substance and Sexual Activity  Alcohol Use Yes   Comment: occ     Social History   Substance and Sexual Activity  Drug Use Yes  . Types: IV, Methamphetamines, Marijuana    Additional Social History:  Allergies:  No Known Allergies  Lab Results:  Results for orders placed or performed during the hospital encounter of 08/28/19 (from the past 48 hour(s))  Comprehensive metabolic panel     Status: Abnormal   Collection Time: 08/28/19  7:39 PM  Result Value Ref Range   Sodium 139 135 - 145 mmol/L   Potassium 4.2 3.5 - 5.1 mmol/L   Chloride 105 98 - 111 mmol/L   CO2 25 22 - 32 mmol/L   Glucose, Bld 102 (H) 70 - 99 mg/dL   BUN 14 6 - 20 mg/dL   Creatinine, Ser 1.19 0.61 - 1.24 mg/dL   Calcium 9.1 8.9 - 10.3 mg/dL   Total Protein 6.9 6.5 - 8.1 g/dL   Albumin 4.1 3.5 - 5.0 g/dL   AST 31 15 - 41 U/L   ALT 37 0 - 44 U/L   Alkaline Phosphatase 55 38 - 126 U/L   Total Bilirubin 0.6 0.3 - 1.2 mg/dL   GFR calc non Af Amer >60 >60 mL/min   GFR calc Af Amer >60 >60 mL/min   Anion gap 9 5 - 15    Comment: Performed at [facility], [place] [address] [place], [place] [address]  Ethanol     Status: None   Collection Time: 08/28/19  7:39 PM  Result Value Ref Range   Alcohol, Ethyl (B) <10 <10 mg/dL    Comment: (NOTE) Lowest detectable limit for serum alcohol is 10 mg/dL. For medical purposes only. Performed at [facility], [place] [address] [place], [place] [address]   Salicylate  level     Status: None   Collection Time: 08/28/19  7:39 PM  Result Value Ref Range   Salicylate Lvl <2.7 2.8 - 30.0 mg/dL    Comment: Performed at [facility], [place] [address] [place], [place] [address]  Acetaminophen level     Status: Abnormal   Collection Time: 08/28/19  7:39 PM  Result Value Ref Range   Acetaminophen (Tylenol), Serum <10 (L) 10 - 30 ug/mL    Comment: (NOTE) Therapeutic concentrations vary significantly. A range of 10-30 ug/mL  may be an effective concentration for many patients. However, some  are best treated at concentrations outside of this range. Acetaminophen concentrations >150 ug/mL at 4 hours after ingestion  and >50 ug/mL at 12 hours after ingestion are often associated with  toxic reactions. Performed at [facility], [address], [place], [place] [address]   cbc     Status: None   Collection Time: 08/28/19  7:39 PM  Result Value Ref Range   WBC 8.8 4.0 - 10.5 K/uL   RBC 5.43 4.22 - 5.81 MIL/uL   Hemoglobin 15.4 13.0 - 17.0 g/dL   HCT 60.4 54.0 - 98.1 %   MCV 89.3 80.0 - 100.0 fL   MCH 28.4 26.0 - 34.0 pg   MCHC 31.8 30.0 - 36.0 g/dL   RDW 19.1 47.8 - 29.5 %   Platelets 235 150 - 400 K/uL   nRBC 0.0 0.0 - 0.2 %    Comment: Performed at [facility], [address] [place], [place] [address]  Rapid urine drug screen (hospital performed)     Status: Abnormal   Collection Time: 08/28/19  7:39 PM  Result Value Ref Range   Opiates NONE DETECTED NONE DETECTED   Cocaine NONE DETECTED NONE DETECTED   Benzodiazepines NONE DETECTED NONE DETECTED   Amphetamines POSITIVE (A) NONE DETECTED   Tetrahydrocannabinol POSITIVE (A) NONE DETECTED   Barbiturates NONE DETECTED NONE DETECTED    Comment: (NOTE) DRUG SCREEN FOR MEDICAL PURPOSES ONLY.  IF CONFIRMATION IS NEEDED FOR ANY PURPOSE, NOTIFY LAB WITHIN 5 DAYS. LOWEST DETECTABLE LIMITS FOR URINE DRUG SCREEN Drug Class                     Cutoff  (ng/mL) Amphetamine and metabolites    1000 Barbiturate and metabolites    200 Benzodiazepine                 200 Tricyclics and metabolites     300 Opiates and metabolites        300 Cocaine and metabolites        300 THC                            50 Performed at [facility], [address] [place], [place] [address]   SARS CORONAVIRUS 2 (TAT 6-24 HRS) Nasopharyngeal     Status: None   Collection Time: 08/28/19  7:39 PM   Specimen: Nasopharyngeal  Result Value Ref Range   SARS Coronavirus 2 NEGATIVE NEGATIVE    Comment: (NOTE) SARS-CoV-2 target nucleic acids are NOT DETECTED. The SARS-CoV-2 RNA is generally detectable in upper and lower respiratory specimens during the acute phase of infection. Negative results do not preclude SARS-CoV-2 infection, do not rule out co-infections with other pathogens, and should not be used as the sole basis for treatment or other patient management decisions. Negative results must be combined with clinical observations, patient history, and epidemiological information. The expected result is Negative. Fact Sheet for Patients: HairSlick.no Fact Sheet for Healthcare Providers: quierodirigir.com This test is not yet approved or cleared by the [place] FDA and  has been authorized for detection and/or diagnosis of SARS-CoV-2 by FDA under an Emergency Use Authorization (EUA). This EUA will remain  in effect (meaning this test can be used) for the duration of the COVID-19 declaration under Section 56 4(b)(1) of the Act, 21 U.S.C. section 360bbb-3(b)(1), unless the authorization is terminated or revoked sooner. Performed at [facility], [address] [place], [place] [address]    Blood Alcohol level:  Lab Results  Component Value Date   [facility] <10 08/28/2019   ETH <10 08/03/2019   Metabolic Disorder Labs:  No results found  for: HGBA1C, MPG No results found  for: PROLACTIN No results found for: CHOL, TRIG, HDL, CHOLHDL, VLDL, LDLCALC  Current Medications: Current Facility-Administered Medications  Medication Dose Route Frequency Provider Last Rate Last Dose  . acetaminophen (TYLENOL) tablet 650 mg  650 mg Oral Q6H PRN Anike, Adaku C, NP   650 mg at 08/29/19 2116  . alum & mag hydroxide-simeth (MAALOX/MYLANTA) 200-200-20 MG/5ML suspension 30 mL  30 mL Oral Q4H PRN Anike, Adaku C, NP      . hydrOXYzine (ATARAX/VISTARIL) tablet 25 mg  25 mg Oral TID PRN Anike, Adaku C, NP   25 mg at 08/29/19 2116  . hydrOXYzine (ATARAX/VISTARIL) tablet 25 mg  25 mg Oral Once Anike, Adaku C, NP      . magnesium hydroxide (MILK OF MAGNESIA) suspension 30 mL  30 mL Oral Daily PRN Anike, Adaku C, NP      . meloxicam (MOBIC) tablet 7.5 mg  7.5 mg Oral Daily Antonieta Pertlary, Greg Lawson, MD   7.5 mg at 08/30/19 1000  . mirtazapine (REMERON) tablet 15 mg  15 mg Oral QHS Antonieta Pertlary, Greg Lawson, MD      . nicotine (NICODERM CQ - dosed in mg/24 hours) patch 21 mg  21 mg Transdermal Daily Armandina StammerNwoko,  I, NP   21 mg at 08/30/19 1031  . traZODone (DESYREL) tablet 100 mg  100 mg Oral Once Anike, Adaku C, NP      . traZODone (DESYREL) tablet 50 mg  50 mg Oral QHS PRN Anike, Adaku C, NP   50 mg at 08/29/19 2116   PTA Medications: No medications prior to admission.   Musculoskeletal: Strength & Muscle Tone: within normal limits Gait & Station: normal Patient leans: N/A  Psychiatric Specialty Exam: Physical Exam  Nursing note and vitals reviewed. Constitutional: He is oriented to person, place, and time. He appears well-developed.  HENT:  Head: Normocephalic.  Eyes: Pupils are equal, round, and reactive to light.  Denies any issues in this area  Neck: Normal range of motion.  Cardiovascular: Normal rate.  Respiratory: Effort normal.  GI: Soft.  Denies any issues in this area  Genitourinary:    Genitourinary Comments: Deferred   Musculoskeletal: Normal range of motion.   Neurological: He is alert and oriented to person, place, and time.  Skin: Skin is warm and dry.    Review of Systems  Constitutional: Negative for chills and fever.  Respiratory: Negative for cough, shortness of breath and wheezing.   Cardiovascular: Negative for chest pain and palpitations.  Gastrointestinal: Negative for abdominal pain, heartburn, nausea and vomiting.  Genitourinary: Negative.   Musculoskeletal: Negative.   Skin: Negative.   Neurological: Negative for dizziness and headaches.  Endo/Heme/Allergies: Negative.   Psychiatric/Behavioral: Positive for depression, substance abuse (UDS (+) for amphetamin & THC) and suicidal ideas. Negative for hallucinations and memory loss. The patient is nervous/anxious and has insomnia.     Blood pressure 106/71, pulse 63, temperature 97.6 F (36.4 C), temperature source Oral, resp. rate 18, height 5\' 11"  (1.803 m), weight 82.1 kg, SpO2 100 %.Body mass index is 25.24 kg/m.  General Appearance: Disheveled  Eye Contact:  Fair  Speech:  Normal Rate  Volume:  Decreased  Mood:  Anxious, Depressed and Dysphoric  Affect:  Congruent  Thought Process:  Coherent and Descriptions of Associations: Intact  Orientation:  Full (Time, Place, and Person)  Thought Content:  Logical  Suicidal Thoughts:  Yes.  without intent/plan  Homicidal Thoughts:  No  Memory:  Immediate;  Fair Recent;   Fair Remote;   Fair  Judgement:  Intact  Insight:  Fair  Psychomotor Activity:  Increased  Concentration:  Concentration: Fair and Attention Span: Fair  Recall:  [employer] of Knowledge:  Fair  Language:  Good  Akathisia:  Negative  Handed:  Right  AIMS (if indicated):     Assets:  Desire for Improvement Resilience  ADL's:  Intact  Cognition:  WNL  Sleep:  Number of Hours: 6.75     Treatment Plan Summary: Daily contact with patient to assess and evaluate symptoms and progress in treatment and Medication management.  Treatment Plan/Recommendations:  1. Admit for crisis management and stabilization, estimated length of stay 3-5 days.   2. Medication management to reduce current symptoms to base line and improve the patient's overall level of functioning: See MAR, Md's SRA & treatment plan.   Observation Level/Precautions:  15 minute checks  Laboratory:  Per ED, UDS (+) for Amphetamine & THC  Psychotherapy: Group sessions   Medications: See MAR  Consultations: As needed  Discharge Concerns: Safety, maintaining sobriety, mood stability   Estimated LOS: 2-4 days.  Other: Admit to the [facility].   Physician Treatment Plan for Primary Diagnosis: Methamphetamine dependence, continuous (HCC)  Long Term Goal(s): Improvement in symptoms so as ready for discharge  Short Term Goals: Ability to identify changes in lifestyle to reduce recurrence of condition will improve, Ability to verbalize feelings will improve and Ability to disclose and discuss suicidal ideas  Physician Treatment Plan for Secondary Diagnosis: Principal Problem:   Methamphetamine dependence, continuous (HCC) Active Problems:   MDD (major depressive disorder)  Long Term Goal(s): Improvement in symptoms so as ready for discharge  Short Term Goals: Ability to identify and develop effective coping behaviors will improve, Compliance with prescribed medications will improve and Ability to identify triggers associated with substance abuse/mental health issues will improve  I certify that inpatient services furnished can reasonably be expected to improve the patient's condition.    Armandina Stammer, NP, PMHNP, FNP-BC 11/6/202012:45 PM

## 2019-08-30 NOTE — Progress Notes (Signed)
Lower extremity venous has been completed.   Preliminary results in CV Proc.   Abram Sander 08/30/2019 10:47 AM

## 2019-08-30 NOTE — B[facility] Group Notes (Signed)
Type of Therapy and Topic: Group Therapy: Trust and Honesty  Participation Level: Minimal    Description of Group:  In this group patients will be asked to explore the value of being honest. Patients will be guided to discuss their thoughts, feelings, and behaviors related to honesty and trusting in others. Patients will process together how trust and honesty relate to forming relationships with peers, family members, and self. Each patient will be challenged to identify and express feelings of being vulnerable. Patients will discuss reasons why people are dishonest and identify alternative outcomes if one was truthful (to self or others). This group will be process-oriented, with patients participating in exploration of their own experiences, giving and receiving support, and processing challenge from other group members.   Therapeutic Goals: 1. Patient will identify why honesty is important to relationships and how honesty overall affects relationships. 2. Patient will identify a situation where they lied or were lied too and the feelings, thought process, and behaviors surrounding the situation 3. Patient will identify the meaning of being vulnerable, how that feels, and how that correlates to being honest with self and others. 4. Patient will identify situations where they could have told the truth, but instead lied and explain reasons of dishonesty.   Summary of Patient Progress:  Daniel Hutchinson was engaged and participated in the group's discussion minimally. Daniel Hutchinson states that it is important to be honest with others so they do not doubt you.      Therapeutic Modalities:  Cognitive Behavioral Therapy Solution Focused Therapy Motivational Interviewing Brief Therapy

## 2019-08-30 NOTE — Progress Notes (Signed)
Called [employer] to set up transportation to [facility] for vascular lab. Pt will be sent with [facility].

## 2019-08-30 NOTE — Progress Notes (Signed)
Recreation Therapy Notes  Date:  11.6.20 Time: 0930 Location: 300 Hall Dayroom  Group Topic: Stress Management  Goal Area(s) Addresses:  Patient will identify positive stress management techniques. Patient will identify benefits of using stress management post d/c.  Intervention: Stress Management  Activity :  Meditation.  LRT played a meditation that focused on letting go.  Patients were to listen to the meditation as it played in order to engage in the activity.  Education:  Stress Management, Discharge Planning.   Education Outcome: Acknowledges Education  Clinical Observations/Feedback:  Pt did not attend group.    Victorino Sparrow, LRT/CTRS         Ria Comment, Leondra Cullin A 08/30/2019 11:05 AM

## 2019-08-30 NOTE — [facility] Suicide Risk Assessment (Signed)
[facility] Admission Suicide Risk Assessment   Nursing information obtained from:  Patient Demographic factors:  Male, Caucasian, Unemployed, Low socioeconomic status Current Mental Status:  Suicidal ideation indicated by patient, Plan includes specific time, place, or method, Intention to act on suicide plan, Self-harm thoughts, Belief that plan would result in death, Suicide plan Loss Factors:  Loss of significant relationship, Financial problems / change in socioeconomic status Historical Factors:  Impulsivity, Family history of mental illness or substance abuse Risk Reduction Factors:  Sense of responsibility to family, Positive social support, Positive coping skills or problem solving skills, Responsible for children under 83 years of age, Positive therapeutic relationship  Total Time spent with patient: 45 minutes Principal Problem: <principal problem not specified> Diagnosis:  Active Problems:   MDD (major depressive disorder)  Subjective Data: Patient is seen and examined.  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  Patient presented to the [facility] on 08/28/2019 with suicidal ideation and ongoing methamphetamine dependence.  The patient had been using methamphetamines for approximately the last 20 years, but he has gotten involved with a woman who he loves, and she will not be with him unless he gets clean.  He stated that he had been doing well with her, written remaining clean and working, but then relapsed since he and his girlfriend broke up.  Unfortunately since then he is lost his job, had to sell his truck, and was at a homeless shelter but then left there a couple of days ago.  He feels overwhelmed and feels like killing himself.  Review of the electronic medical record revealed at least 2-3 emergency room visits in the last several months secondary to his methamphetamine dependence.  He has not been in  rehab or detox for many years, and denied any charges or court dates coming forward.  Review of his laboratories revealed no alcohol, but drug screen positive for methamphetamines as well as marijuana.  He was admitted to the hospital for evaluation and stabilization.  Continued Clinical Symptoms:  Alcohol Use Disorder Identification Test Final Score (AUDIT): 2 The "Alcohol Use Disorders Identification Test", Guidelines for Use in Primary Care, Second Edition.  World Pharmacologist [facility]). Score between 0-7:  no or low risk or alcohol related problems. Score between 8-15:  moderate risk of alcohol related problems. Score between 16-19:  high risk of alcohol related problems. Score 20 or above:  warrants further diagnostic evaluation for alcohol dependence and treatment.   CLINICAL FACTORS:   Depression:   Anhedonia Comorbid alcohol abuse/dependence Hopelessness Impulsivity Insomnia Alcohol/Substance Abuse/Dependencies   Musculoskeletal: Strength & Muscle Tone: within normal limits Gait & Station: normal Patient leans: N/A  Psychiatric Specialty Exam: Physical Exam  Nursing note and vitals reviewed. Constitutional: He is oriented to person, place, and time. He appears well-developed and well-nourished.  HENT:  Head: Normocephalic and atraumatic.  Respiratory: Effort normal.  Neurological: He is alert and oriented to person, place, and time.    ROS  Blood pressure 106/71, pulse 63, temperature 97.6 F (36.4 C), temperature source Oral, resp. rate 18, height 5\' 11"  (1.803 m), weight 82.1 kg, SpO2 100 %.Body mass index is 25.24 kg/m.  General Appearance: Disheveled  Eye Contact:  Fair  Speech:  Normal Rate  Volume:  Decreased  Mood:  Anxious, Depressed and Dysphoric  Affect:  Congruent  Thought Process:  Coherent and Descriptions of Associations: Intact  Orientation:  Full (Time, Place, and Person)  Thought Content:  Logical  Suicidal Thoughts:  Yes.  without  intent/plan  Homicidal Thoughts:  No  Memory:  Immediate;   Fair Recent;   Fair Remote;   Fair  Judgement:  Intact  Insight:  Fair  Psychomotor Activity:  Increased  Concentration:  Concentration: Fair and Attention Span: Fair  Recall:  [employer] of Knowledge:  Fair  Language:  Good  Akathisia:  Negative  Handed:  Right  AIMS (if indicated):     Assets:  Desire for Improvement Resilience  ADL's:  Intact  Cognition:  WNL  Sleep:  Number of Hours: 6.75      COGNITIVE FEATURES THAT CONTRIBUTE TO RISK:  None    SUICIDE RISK:   Minimal: No identifiable suicidal ideation.  Patients presenting with no risk factors but with morbid ruminations; may be classified as minimal risk based on the severity of the depressive symptoms  PLAN OF CARE: Patient is seen and examined.  Patient is a 43 year old male with the above-stated past psychiatric history was admitted with amphetamine dependence, substance-induced mood disorder, arthritic pain, and lower extremity swelling.  He will be admitted to the hospital.  He will be integrated into the milieu.  He will be encouraged to attend groups.  We discussed the fact that the majority of amphetamine withdrawal symptoms are fatigue, sleep and mood decrease.  It does not appear that he has been treated with antidepressant medications for any of these.  I will start him on mirtazapine 15 mg p.o. nightly and titrate that during the course the hospitalization.  He will also have available hydroxyzine and trazodone for sleep.  He will meet with social work with regard to substance rehabilitation facility options.  Review of his laboratories revealed a mildly increased creatinine at 1.19, and this is actually lower than it had been several weeks ago.  His CBC and differential are within normal limits.  Drug screen is as per above.  He has some lower extremity swelling as well as what appears to be either peripheral vascular disease or petechiae.  His platelet  numbers are normal at 235,000.  I will go on and order lower extremity ultrasounds just to make sure about potential DVTs given his substance issues.  We will start some low-dose anti-inflammatories for his various arthritic pains.  I certify that inpatient services furnished can reasonably be expected to improve the patient's condition.   Antonieta Pert, MD 08/30/2019, 9:30 AM
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
## 2019-08-31 DIAGNOSIS — F1520 Other stimulant dependence, uncomplicated: Principal | ICD-10-CM
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
## 2019-08-31 NOTE — Progress Notes (Signed)
[facility] Group Notes:  (Nursing/MHT/Case Management/Adjunct)  Date:  08/31/2019  Time:  2030  Type of Therapy:  wrap up group  Participation Level:  Active  Participation Quality:  Appropriate, Attentive, Sharing and Supportive  Affect:  Appropriate  Cognitive:  Appropriate  Insight:  Improving  Engagement in Group:  Engaged  Modes of Intervention:  Clarification, Education and Support  Summary of Progress/Problems: Pt plans on finding a new group of friends to hang out with. Pt is grateful for his girlfriend with whom he had a visit from this evening.   Shellia Cleverly 08/31/2019, 10:43 PM

## 2019-08-31 NOTE — Progress Notes (Signed)
D. Pt observed interacting well with peers in the dayroom- smiles and is friendly upon approach. Pt reports having slept well last night and states, "sleep makes all the difference". Per pt's self inventory, pt rated his depression, hopelessness and anxiety a 2/1/0, respectively. Pt writes that his goal today is "continue to maintain feeling better".Pt currently denies SI/HI and AVH A. Labs and vitals monitored. Pt compliant with medications. Pt supported emotionally and encouraged to express concerns and ask questions.   R. Pt remains safe with 15 minute checks. Will continue POC.

## 2019-08-31 NOTE — Progress Notes (Signed)
[facility] Group Notes:  (Nursing/MHT/Case Management/Adjunct)  Date:  08/31/2019  Time:  1:24 PM  Type of Therapy:  Nurse Education  Participation Level:  Did Not Attend. Pt asleep.    Nyjai Graff L 08/31/2019, 1:24 PM 

## 2019-08-31 NOTE — Progress Notes (Signed)
Daniel Hutchinson NOVEL CORONAVIRUS (COVID-19) DAILY CHECK-OFF SYMPTOMS - answer yes or no to each - every day NO YES  Have you had a fever in the past 24 hours?  . Fever (Temp > 37.80C / 100F) X   Have you had any of these symptoms in the past 24 hours? . New Cough .  Sore Throat  .  Shortness of Breath .  Difficulty Breathing .  Unexplained Body Aches   X   Have you had any one of these symptoms in the past 24 hours not related to allergies?   . Runny Nose .  Nasal Congestion .  Sneezing   X   If you have had runny nose, nasal congestion, sneezing in the past 24 hours, has it worsened?  X   EXPOSURES - check yes or no X   Have you traveled outside the state in the past 14 days?  X   Have you been in contact with someone with a confirmed diagnosis of COVID-19 or PUI in the past 14 days without wearing appropriate PPE?  X   Have you been living in the same home as a person with confirmed diagnosis of COVID-19 or a PUI (household contact)?    X   Have you been diagnosed with COVID-19?    X              What to do next: Answered NO to all: Answered YES to anything:   Proceed with unit schedule Follow the [facility] Inpatient Flowsheet.   

## 2019-08-31 NOTE — [facility] Group Notes (Signed)
LCSW Group Therapy Note  08/31/2019    10:00-11:00am   Type of Therapy and Topic:  Group Therapy: Early Messages Received About Anger  Participation Level:  Active   Description of Group:   In this group, patients shared and discussed the early messages received in their lives about anger through parental or other adult modeling, teaching, repression, punishment, violence, and more.  Participants identified how those childhood lessons influence even now how they usually or often react when angered.  The group discussed that anger is a secondary emotion and what may be the underlying emotional themes that come out through anger outbursts or that are ignored through anger suppression.  Finally, as a group there was a conversation about the workbook's quote that "There is nothing wrong with anger; it is just a sign something needs to change."     Therapeutic Goals: 1. Patients will identify one or more childhood message about anger that they received and how it was taught to them. 2. Patients will discuss how these childhood experiences have influenced and continue to influence their own expression or repression of anger even today. 3. Patients will explore possible primary emotions that tend to fuel their secondary emotion of anger. 4. Patients will learn that anger itself is normal and cannot be eliminated, and that healthier coping skills can assist with resolving conflict rather than worsening situations.  Summary of Patient Progress:  The patient shared that his childhood lessons about [place] he was taught that it was not okay to be angry.  As a punishment when he would become angry, he would have to pick rocks out of the soil and throw them into the woods, which was physically demanding..  As a result, one of his preferred methods of dealing with anger is to engage in physically rigorous activity such as weeding or throwing rocks or working out.  He finds  that while that exhausts him, it does not necessarily get rid of the underlying issues.  We discussed what steps could be added to make it a more effective outcome.  Therapeutic Modalities:   Cognitive Behavioral Therapy Motivation Interviewing  Maretta Los  .

## 2019-08-31 NOTE — Progress Notes (Signed)
[facility]  08/31/2019 11:19 AM Daniel Hutchinson  MRN:  [id number]  Subjective: Daniel Hutchinson reports, "I'm doing good. I slept well last night. I'm feeling depressed or anxious. Taking the medicines, no side effects felt. I have no complaints today".  Objective: Patient is a 43 year old male with a past psychiatric history significant for methamphetamine dependence. Patient [facility] with suicidal ideation and ongoing methamphetamine dependence. The patient had been using methamphetamines for approximately the last 20 years, but he has gotten involved with a woman who he loves, and she will not be with him unless he gets clean. He stated that he had been doing well with her, written remaining clean and working, but then relapsed since he and his girlfriend broke up. 08-31-19, Patient is seen, chart reviewed. The chart findings discussed with the treatment team. He presents well this morning. His affect is good as he is making good eye contact. He is visible on the unit, attending group sessions. He denies any symptoms of depression, anxiety or substance withdrawal symptoms. He denies any pain, discomforts or new issues. He denies any SIHI, AVH, delusional thoughts or paranoia. He does not appear to be responding to any internal stimuli. He is taking & tolerating his treatment regimen without any adverse effects or reactions reported. Daniel Hutchinson is in agreement to continue his current plan of care as already in progress.  Principal Problem: Methamphetamine dependence, continuous (HCC)  Diagnosis: Principal Problem:   Methamphetamine dependence, continuous (HCC) Active Problems:   MDD (major depressive disorder)  Total Time spent with patient: 25 minutes  Past Psychiatric History: See H&P  Past Medical History: History reviewed. No pertinent past medical history. History reviewed. No pertinent surgical history.  Family History: History  reviewed. No pertinent family history.  Family Psychiatric  History: See H&P  Social History:  Social History   Substance and Sexual Activity  Alcohol Use Yes   Comment: occ     Social History   Substance and Sexual Activity  Drug Use Yes  . Types: IV, Methamphetamines, Marijuana    Social History   Socioeconomic History  . Marital status: Single    Spouse name: Not on file  . Number of children: Not on file  . Years of education: Not on file  . Highest education level: Not on file  Occupational History  . Not on file  Social Needs  . Financial resource strain: Not on file  . Food insecurity    Worry: Not on file    Inability: Not on file  . Transportation needs    Medical: Not on file    Non-medical: Not on file  Tobacco Use  . Smoking status: Current Every Day Smoker    Packs/day: 2.00    Types: Cigarettes  . Smokeless tobacco: Never Used  Substance and Sexual Activity  . Alcohol use: Yes    Comment: occ  . Drug use: Yes    Types: IV, Methamphetamines, Marijuana  . Sexual activity: Yes  Lifestyle  . Physical activity    Days per week: Not on file    Minutes per session: Not on file  . Stress: Not on file  Relationships  . Social Musicianconnections    Talks on phone: Not on file    Gets together: Not on file    Attends religious service: Not on file    Active member of club or organization: Not on file    Attends meetings of clubs  or organizations: Not on file    Relationship status: Not on file  Other Topics Concern  . Not on file  Social History Narrative  . Not on file   Additional Social History:   Sleep: Good  Appetite:  Good  Current Medications: Current Facility-Administered Medications  Medication Dose Route Frequency Provider Last Rate Last Dose  . acetaminophen (TYLENOL) tablet 650 mg  650 mg Oral Q6H PRN Anike, Adaku C, NP   650 mg at 08/29/19 2116  . alum & mag hydroxide-simeth (MAALOX/MYLANTA) 200-200-20 MG/5ML suspension 30 mL  30 mL  Oral Q4H PRN Anike, Adaku C, NP      . hydrOXYzine (ATARAX/VISTARIL) tablet 25 mg  25 mg Oral TID PRN Anike, Adaku C, NP   25 mg at 08/29/19 2116  . hydrOXYzine (ATARAX/VISTARIL) tablet 25 mg  25 mg Oral Once Anike, Adaku C, NP      . magnesium hydroxide (MILK OF MAGNESIA) suspension 30 mL  30 mL Oral Daily PRN Anike, Adaku C, NP      . meloxicam (MOBIC) tablet 7.5 mg  7.5 mg Oral Daily Antonieta Pert, MD   7.5 mg at 08/31/19 0819  . mirtazapine (REMERON) tablet 15 mg  15 mg Oral QHS Antonieta Pert, MD   15 mg at 08/30/19 2121  . nicotine (NICODERM CQ - dosed in mg/24 hours) patch 21 mg  21 mg Transdermal Daily Armandina Stammer I, NP   21 mg at 08/31/19 9021  . traZODone (DESYREL) tablet 100 mg  100 mg Oral Once Anike, Adaku C, NP      . traZODone (DESYREL) tablet 50 mg  50 mg Oral QHS PRN Anike, Adaku C, NP   50 mg at 08/30/19 2122   Lab Results: No results found for this or any previous visit (from the past 48 hour(s)).  Blood Alcohol level:  Lab Results  Component Value Date   ETH <10 08/28/2019   ETH <10 08/03/2019   Metabolic Disorder Labs: No results found for: HGBA1C, MPG No results found for: PROLACTIN No results found for: CHOL, TRIG, HDL, CHOLHDL, VLDL, LDLCALC  Physical Findings: AIMS: Facial and Oral Movements Muscles of Facial Expression: None, normal Lips and Perioral Area: None, normal Jaw: None, normal Tongue: None, normal,Extremity Movements Upper (arms, wrists, hands, fingers): None, normal Lower (legs, knees, ankles, toes): None, normal, Trunk Movements Neck, shoulders, hips: None, normal, Overall Severity Severity of abnormal movements (highest score from questions above): None, normal Incapacitation due to abnormal movements: None, normal Patient's awareness of abnormal movements (rate only patient's report): No Awareness, Dental Status Current problems with teeth and/or dentures?: No Does patient usually wear dentures?: No  CIWA:    COWS:      Musculoskeletal: Strength & Muscle Tone: within normal limits Gait & Station: normal Patient leans: N/A  Psychiatric Specialty Exam: Physical Exam  Nursing note and vitals reviewed. Constitutional: He is oriented to person, place, and time. He appears well-developed.  Neck: Normal range of motion.  Cardiovascular: Normal rate.  Respiratory: Effort normal.  Genitourinary:    Genitourinary Comments: Deferred   Musculoskeletal: Normal range of motion.  Neurological: He is alert and oriented to person, place, and time.  Skin: Skin is warm and dry.    Review of Systems  Constitutional: Negative for chills and fever.  Respiratory: Negative for cough, shortness of breath and wheezing.   Cardiovascular: Negative for chest pain and palpitations.  Gastrointestinal: Negative for heartburn, nausea and vomiting.  Musculoskeletal: Negative for myalgias.  Skin: Negative.   Neurological: Negative for dizziness and headaches.  Psychiatric/Behavioral: Positive for substance abuse (Hx. Amphetamine & THC use disorders). Negative for depression, hallucinations, memory loss and suicidal ideas. The patient is not nervous/anxious and does not have insomnia.     Blood pressure 116/87, pulse 77, temperature 97.6 F (36.4 C), temperature source Oral, resp. rate 18, height 5\' 11"  (1.803 m), weight 82.1 kg, SpO2 100 %.Body mass index is 25.24 kg/m.  General Appearance:Casual, fairly groomed.  Eye Contact:Good  Speech:Normal Rate  Volume:Normal  Mood:Euthymic  Affect:Reactive, congruent  Thought Process:Coherent and Descriptions of Associations:Intact  Orientation:Full (Time, Place, and Person)  Thought Content:Logical  Suicidal Thoughts:Denies any thoughts, plans or intent.  Homicidal Thoughts:Denies  Memory:Immediate;Fair Recent;Fair Remote;Fair  Judgement:Intact  Insight:Fair  Psychomotor Activity: Normal  Concentration:Concentration:Goodand Attention Span:  [place]  Akathisia:Negative  Handed:Right  AIMS (if indicated):   Assets:Desire for Improvement Resilience  ADL's:Intact  Cognition:WNL      Sleep:  Number of Hours: 6.75   Treatment Plan Summary:Daily contact with patient to assess and evaluate symptoms and progress in treatment and Medication management.  -Continue inpatient hospitalization.  -Will continue today 08/31/2019 plan as below except where it is noted.  -Depression  -Continue Mirtazapine 15 mg po Q hs.  -Anxiety  -Continue atarax 25 mg po q8h prn anxiety  -Insomnia  -Continue Trazodone 50 mg po prn qhs  -Pain                      -Continue Mobic 7.5 g po daily   -Encourage participation in groups and therapeutic milieu  -Disposition planning will be ongoing  Lindell Spar, NP, PMHNP, FNP-BC 08/31/2019, 11:19 AM

## 2019-09-01 MED ORDER — LOPERAMIDE HCL 2 MG PO CAPS
4.0000 mg | ORAL_CAPSULE | ORAL | Status: DC | PRN
  Administered 2019-09-01: 4 mg via ORAL
  Filled 2019-09-01: qty 2

## 2019-09-01 MED ORDER — ONDANSETRON 4 MG PO TBDP
4.0000 mg | ORAL_TABLET | Freq: Four times a day (QID) | ORAL | Status: DC | PRN
  Administered 2019-09-01: 4 mg via ORAL
  Filled 2019-09-01: qty 1

## 2019-09-01 NOTE — BHH Group Notes (Signed)
[facility] LCSW Group Therapy Note  09/01/2019    Type of Therapy and Topic:  Group Therapy:  A Hero Worthy of Support  Participation Level:  Active   Description of Group:  Patients in this group were introduced to the concept that additional supports including self-support are an essential part of recovery.  Matching needs with supports to help fulfill those needs was explained.  A song "I Got To Live" was played for the group and was followed by a discussion of what it meant to participants.   The consensus was that the message was to give themselves permission to see happiness in life.  A song entitled "My Own Hero" was played and a group discussion ensued in which patients stated it inspired them to help themselves in order to succeed, because other people cannot achieve their goals such as sobriety or stability for them.  A song was played called "I Am Enough" which led to a discussion about being willing to believe we are worth the effort of being a self-support.   Therapeutic Goals: 1)  demonstrate the importance of being a key part of one's own support system 2)  discuss various available supports 3)  encourage patient to use music as part of their self-support and focus on goals 4)  elicit ideas from patients about supports that need to be added   Summary of Patient Progress:  The patient expressed that his main supports that are healthy are his fiancee and daughter.  He listened a great deal in group, was attentive to the discussion and to the songs, but did not talk a great deal today.  Therapeutic Modalities:   Motivational Interviewing Activity  Maretta Los

## 2019-09-01 NOTE — Progress Notes (Signed)
D:  Patient's self inventory sheet, patient sleeps good, medication helpful.  Good appetite, normal energy level, good concentration.  Denied depression, hopeless and anxiety.  Denied withdrawals.  Denied SI, contracts for safety.  Denied physical problems.  Physical pain, legs/shoulder, pain medicine helpful.  Goal is see girlfriend, wait til visittation.  Does have discharge plans. A:  Medications administered per MD orders.  Emotional support and encouragement given patient. R:  Denied SI and HI, contracts for safety.  Denied A/V hallucinations.  Safety maintained with 15 minute checks.

## 2019-09-01 NOTE — Progress Notes (Signed)
Psychoeducational Group Note  Date:  09/01/2019 Time:  2030 Group Topic/Focus:  wrap up group  Participation Level: Did Not Attend  Participation Quality:  Not Applicable  Affect:  Not Applicable  Cognitive:  Not Applicable  Insight:  Not Applicable  Engagement in Group: Not Applicable  Additional Comments:  Pt had an upset stomach with diarrhea and nausea. Pt wanted to attend group but laid down instead.    Shellia Cleverly 09/01/2019, 9:40 PM

## 2019-09-01 NOTE — Progress Notes (Signed)
D.  Pt pleasant on approach, no complaints voiced.  Pt was positive for evening wrap up group, observed engaged appropriately with peers on the unit.  Pt denies SI/HI/AVH at this time.  A.  Support and encouragement offered, medication given as ordered.  R.  Pt remains safe on the unit, will continue to monitor. 

## 2019-09-01 NOTE — Progress Notes (Signed)
[facility] MD Progress Note  09/01/2019 9:55 AM Daniel Hutchinson  MRN:  [id number]  Subjective: Daniel Hutchinson stated " feeling and doing better.  Reports some hopeful to discharge tomorrow I have a lot planned."  Objective: Daniel Hutchinson observed resting in bed.  He denies suicidal or homicidal ideations.  Denies auditory or visual hallucinations.  Denies any methamphetamine withdrawal symptoms and/or cravings during this assessment.  He reports "this is the best that I have ever felt in a while."  Reports plan to get a job and get married to his girlfriend.  Reports he is not going back to his hometown as he is going to focus his efforts in [place].  Reports a good appetite.  States he is resting well throughout the night.  Denies depression or depressive symptoms.  Patient appears to be future and goal oriented.  We will continue to monitor for safety.  Support, encouragement and reassurance was provided.  Principal Problem: Methamphetamine dependence, continuous (HCC)  Diagnosis: Principal Problem:   Methamphetamine dependence, continuous (HCC) Active Problems:   MDD (major depressive disorder)  Total Time spent with patient: 25 minutes  Past Psychiatric History: See H&P  Past Medical History: History reviewed. No pertinent past medical history. History reviewed. No pertinent surgical history.  Family History: History reviewed. No pertinent family history.  Family Psychiatric  History: See H&P  Social History:  Social History   Substance and Sexual Activity  Alcohol Use Yes   Comment: occ     Social History   Substance and Sexual Activity  Drug Use Yes  . Types: IV, Methamphetamines, Marijuana    Social History   Socioeconomic History  . Marital status: Single    Spouse name: Not on file  . Number of children: Not on file  . Years of education: Not on file  . Highest education level: Not on file  Occupational History  . Not on file  Social Needs  . Financial resource strain:  Not on file  . Food insecurity    Worry: Not on file    Inability: Not on file  . Transportation needs    Medical: Not on file    Non-medical: Not on file  Tobacco Use  . Smoking status: Current Every Day Smoker    Packs/day: 2.00    Types: Cigarettes  . Smokeless tobacco: Never Used  Substance and Sexual Activity  . Alcohol use: Yes    Comment: occ  . Drug use: Yes    Types: IV, Methamphetamines, Marijuana  . Sexual activity: Yes  Lifestyle  . Physical activity    Days per week: Not on file    Minutes per session: Not on file  . Stress: Not on file  Relationships  . Social Musician on phone: Not on file    Gets together: Not on file    Attends religious service: Not on file    Active member of club or organization: Not on file    Attends meetings of clubs or organizations: Not on file    Relationship status: Not on file  Other Topics Concern  . Not on file  Social History Narrative  . Not on file   Additional Social History:   Sleep: Good  Appetite:  Good  Current Medications: Current Facility-Administered Medications  Medication Dose Route Frequency Provider Last Rate Last Dose  . acetaminophen (TYLENOL) tablet 650 mg  650 mg Oral Q6H PRN Anike, Adaku C, NP   650 mg at 08/29/19 2116  .  alum & mag hydroxide-simeth (MAALOX/MYLANTA) 200-200-20 MG/5ML suspension 30 mL  30 mL Oral Q4H PRN Anike, Adaku C, NP      . hydrOXYzine (ATARAX/VISTARIL) tablet 25 mg  25 mg Oral TID PRN Anike, Adaku C, NP   25 mg at 08/29/19 2116  . hydrOXYzine (ATARAX/VISTARIL) tablet 25 mg  25 mg Oral Once Anike, Adaku C, NP      . magnesium hydroxide (MILK OF MAGNESIA) suspension 30 mL  30 mL Oral Daily PRN Anike, Adaku C, NP      . meloxicam (MOBIC) tablet 7.5 mg  7.5 mg Oral Daily Antonieta Pertlary, Greg Lawson, MD   7.5 mg at 09/01/19 0805  . mirtazapine (REMERON) tablet 15 mg  15 mg Oral QHS Antonieta Pertlary, Greg Lawson, MD   15 mg at 08/31/19 2104  . nicotine (NICODERM CQ - dosed in mg/24 hours)  patch 21 mg  21 mg Transdermal Daily Armandina StammerNwoko, Agnes I, NP   21 mg at 09/01/19 0805  . traZODone (DESYREL) tablet 100 mg  100 mg Oral Once Anike, Adaku C, NP      . traZODone (DESYREL) tablet 50 mg  50 mg Oral QHS PRN Anike, Adaku C, NP   50 mg at 08/31/19 2104   Lab Results: No results found for this or any previous visit (from the past 48 hour(s)).  Blood Alcohol level:  Lab Results  Component Value Date   ETH <10 08/28/2019   ETH <10 08/03/2019   Metabolic Disorder Labs: No results found for: HGBA1C, MPG No results found for: PROLACTIN No results found for: CHOL, TRIG, HDL, CHOLHDL, VLDL, LDLCALC  Physical Findings: AIMS: Facial and Oral Movements Muscles of Facial Expression: None, normal Lips and Perioral Area: None, normal Jaw: None, normal Tongue: None, normal,Extremity Movements Upper (arms, wrists, hands, fingers): None, normal Lower (legs, knees, ankles, toes): None, normal, Trunk Movements Neck, shoulders, hips: None, normal, Overall Severity Severity of abnormal movements (highest score from questions above): None, normal Incapacitation due to abnormal movements: None, normal Patient's awareness of abnormal movements (rate only patient's report): No Awareness, Dental Status Current problems with teeth and/or dentures?: No Does patient usually wear dentures?: No  CIWA:    COWS:     Musculoskeletal: Strength & Muscle Tone: within normal limits Gait & Station: normal Patient leans: N/A  Psychiatric Specialty Exam: Physical Exam  Nursing note and vitals reviewed. Constitutional: He is oriented to person, place, and time. He appears well-developed.  Neck: Normal range of motion.  Cardiovascular: Normal rate.  Respiratory: Effort normal.  Genitourinary:    Genitourinary Comments: Deferred   Musculoskeletal: Normal range of motion.  Neurological: He is alert and oriented to person, place, and time.  Skin: Skin is warm and dry.    Review of Systems   Constitutional: Negative for chills and fever.  Respiratory: Negative for cough, shortness of breath and wheezing.   Cardiovascular: Negative for chest pain and palpitations.  Gastrointestinal: Negative for heartburn, nausea and vomiting.  Musculoskeletal: Negative for myalgias.  Skin: Negative.   Neurological: Negative for dizziness and headaches.  Psychiatric/Behavioral: Positive for substance abuse (Hx. Amphetamine & THC use disorders). Negative for depression, hallucinations, memory loss and suicidal ideas. The patient is not nervous/anxious and does not have insomnia.     Blood pressure 116/87, pulse 77, temperature 97.6 F (36.4 C), temperature source Oral, resp. rate 18, height 5\' 11"  (1.803 m), weight 82.1 kg, SpO2 100 %.Body mass index is 25.24 kg/m.  General Appearance:Casual, fairly groomed.  Eye Contact:Good  Speech:Normal Rate  Volume:Normal  Mood:Euthymic  Affect:Reactive, congruent  Thought Process:Coherent and Descriptions of Associations:Intact  Orientation:Full (Time, Place, and Person)  Thought Content:Logical  Suicidal Thoughts:Denies any thoughts, plans or intent.  Homicidal Thoughts:Denies  Memory:Immediate;Fair Recent;Fair Remote;Fair  Judgement:Intact  Insight:Fair  Psychomotor Activity: Normal  Concentration:Concentration:Goodand Attention Span: [place]  Akathisia:Negative  Handed:Right  AIMS (if indicated):   Assets:Desire for Improvement Resilience  ADL's:Intact  Cognition:WNL      Sleep:  Number of Hours: 5   Treatment Plan Summary:Daily contact with patient to assess and evaluate symptoms and progress in treatment and Medication management.  Continue with current treatment plan on  09/01/2019 plan as below except where it is noted.  Depression Continue Mirtazapine 15 mg po Q hs.  Anxiety Continue atarax 25 mg po q8h prn  anxiety  Insomnia Continue Trazodone 50 mg po prn qhs  Pain          Continue Mobic 7.5 g po daily   Encourage participation in groups and therapeutic milieu Disposition planning will be ongoing  Derrill Center, NP 09/01/2019, 9:55 AM

## 2019-09-02 MED ORDER — NICOTINE 21 MG/24HR TD PT24: 21.0000 mg | MEDICATED_PATCH | Freq: Every day | TRANSDERMAL | 0 refills | Status: DC

## 2019-09-02 MED ORDER — MIRTAZAPINE 15 MG PO TABS: 15.0000 mg | ORAL_TABLET | Freq: Every day | ORAL | 0 refills | Status: DC

## 2019-09-02 MED ORDER — TRAZODONE HCL 50 MG PO TABS: 50.0000 mg | ORAL_TABLET | Freq: Every evening | ORAL | 0 refills | Status: DC | PRN

## 2019-09-02 MED ORDER — HYDROXYZINE HCL 25 MG PO TABS: 25.0000 mg | ORAL_TABLET | Freq: Three times a day (TID) | ORAL | 0 refills | Status: DC | PRN

## 2019-09-02 NOTE — Progress Notes (Signed)
Discharge Note:  Patient discharged home with family member.  Denied SI and HI.   Denied A/V hallucinations.  Suicide prevention information given and discussed with patient who stated he understood and had no questions.  Patient stated he received all his belongings, clothing, toiletries, etc.  Patient stated he appreciated all assistance received from [facility] staff.  All required discharge information given to patient at discharge.

## 2019-09-02 NOTE — BHH Suicide Risk Assessment (Signed)
[facility] Discharge Suicide Risk Assessment   Principal Problem: Methamphetamine dependence, continuous ([place]) Discharge Diagnoses: Principal Problem:   Methamphetamine dependence, continuous ([place]) Active Problems:   MDD (major depressive disorder)   Total Time spent with patient: 15 minutes  Musculoskeletal: Strength & Muscle Tone: within normal limits Gait & Station: normal Patient leans: N/A  Psychiatric Specialty Exam: Review of Systems  All other systems reviewed and are negative.   Blood pressure 135/89, pulse 91, temperature 98 F (36.7 C), temperature source Oral, resp. rate 18, height 5\' 11"  (1.803 m), weight 82.1 kg, SpO2 97 %.Body mass index is 25.24 kg/m.  General Appearance: Casual  Eye Contact::  Good  Speech:  Normal Rate409  Volume:  Normal  Mood:  Euthymic  Affect:  Congruent  Thought Process:  Coherent and Descriptions of Associations: Intact  Orientation:  Full (Time, Place, and Person)  Thought Content:  Logical  Suicidal Thoughts:  No  Homicidal Thoughts:  No  Memory:  Immediate;   Fair Recent;   Fair Remote;   Fair  Judgement:  Intact  Insight:  Fair  Psychomotor Activity:  Normal  Concentration:  Fair  Recall:  [employer] of Knowledge:Fair  Language: Good  Akathisia:  Negative  Handed:  Right  AIMS (if indicated):     Assets:  Desire for Improvement Resilience  Sleep:  Number of Hours: 6.75  Cognition: WNL  ADL's:  Intact   Mental Status Per Nursing Assessment::   On Admission:  Suicidal ideation indicated by patient, Plan includes specific time, place, or method, Intention to act on suicide plan, Self-harm thoughts, Belief that plan would result in death, Suicide plan  Demographic Factors:  Male, Divorced or widowed, Caucasian and Unemployed  Loss Factors: NA  Historical Factors: Impulsivity  Risk Reduction Factors:   Living with another person, especially a relative and Positive social support  Continued Clinical Symptoms:   Depression:   Comorbid alcohol abuse/dependence Impulsivity Alcohol/Substance Abuse/Dependencies  Cognitive Features That Contribute To Risk:  None    Suicide Risk:  Minimal: No identifiable suicidal ideation.  Patients presenting with no risk factors but with morbid ruminations; may be classified as minimal risk based on the severity of the depressive symptoms    Plan Of Care/Follow-up recommendations:  Activity:  ad lib  Sharma Covert, MD 09/02/2019, 9:02 AM

## 2019-09-02 NOTE — Progress Notes (Signed)
  [facility] Adult Case Management Discharge Plan :  Will you be returning to the same living situation after discharge:  No. Patient is discharging home with his girlfriend in [place], [place].  At discharge, do you have transportation home?: Yes,  patient's girlfriend is picking him up Do you have the ability to pay for your medications: No.  Release of information consent forms completed and in the chart;  Patient's signature needed at discharge.  Patient to Follow up at: Follow-up Information    [facility]. Go on 09/06/2019.   Why: Appointment for medication management is Friday, 09/06/19 at 11:30am. Please be sure to bring your Photo ID and any discharge paperwork, inclusing your list of medications. If you have any additional questions or concerns, please call the office.  Contact information: [address] [place], [place] [address]  P: [phone] F: [phone]       [facility] Go to.   Specialty: Behavioral Health Why: Walk-in hours for substance abuse therapy (SAIOP) is Monday-Friday from 8:00am-12:00pm and 1:00pm-3:00pm. Please be sure to call if you have any questions or concerns.  Contact information: [place] [address] [phone]           Next level of care provider has access to [place] and Suicide Prevention discussed: Yes,  with patient's girlfriend     Has patient been referred to the Quitline?: Patient refused referral  Patient has been referred for addiction treatment: Yes  Daniel Hutchinson, [place] 09/02/2019, 10:39 AM

## 2019-09-02 NOTE — Progress Notes (Signed)
D.  Pt had been nauseated and vomiting at beginning of shift, states also diarrhea.  Pt stated that onset was after eating dinner.  Pt was afebrile and VSS.  Pt did appear to sleep well last night with 6.5 hours recorded.  A.  Spoke with NP and orders received, Pt unable to take scheduled medications due to illness.  R.  Pt had no further vomiting or diarrhea after ordered medications given for this.

## 2019-09-02 NOTE — Progress Notes (Signed)
D:  Patient denied SI and HI, contracts for safety.  Denied A/V hallucinations.   A:  Medications administered per MD orders.  Emotional support and encouragement given patient. R:  Safety maintained with 15 minute checks.  

## 2019-09-02 NOTE — BHH Suicide Risk Assessment (Signed)
[place] INPATIENT:  Family/Significant Other Suicide Prevention Education  Suicide Prevention Education:  Education Completed; Pt's fiance, Daniel Hutchinson,  (name of family member/significant other) has been identified by the patient as the family member/significant other with whom the patient will be residing, and identified as the person(s) who will aid the patient in the event of a mental health crisis (suicidal ideations/suicide attempt).  With written consent from the patient, the family member/significant other has been provided the following suicide prevention education, prior to the and/or following the discharge of the patient.  The suicide prevention education provided includes the following:  Suicide risk factors  Suicide prevention and interventions  National Suicide Hotline telephone number  [facility] assessment telephone number  [facility] Emergency Assistance [place] and/or Residential Mobile Crisis Unit telephone number  Request made of family/significant other to:  Remove weapons (e.g., guns, rifles, knives), all items previously/currently identified as safety concern.    Remove drugs/medications (over-the-counter, prescriptions, illicit drugs), all items previously/currently identified as a safety concern.  The family member/significant other verbalizes understanding of the suicide prevention education information provided.  The family member/significant other agrees to remove the items of safety concern listed above.   CSW contacted pt's fiance, Daniel Hutchinson. Pt's fiance asked about the patient's aftercare and wanted to know if he is going to places that do not take insurance. Pt's fiance stated that she will be coming to pick him up and stated that she does not have any other questions or concerns.   Trecia Rogers 09/02/2019, 9:40 AM

## 2019-09-02 NOTE — Discharge Summary (Signed)
Physician Discharge Summary Note  Patient:  Daniel Hutchinson is an 43 y.o., male MRN:  [id number] DOB:  Apr 10, 1976 Patient phone:  [phone] (home)  Patient address:   [place] [address],  Total Time spent with patient: 15 minutes  Date of Admission:  08/29/2019 Date of Discharge: 09/02/19  Reason for Admission:  Methamphetamine dependence with suicidal ideation  Principal Problem: Methamphetamine dependence, continuous ([place]) Discharge Diagnoses: Principal Problem:   Methamphetamine dependence, continuous ([place]) Active Problems:   MDD (major depressive disorder)   Past Psychiatric History: Long history of methamphetamine use disorder. Denies prior suicide attempts.  Past Medical History: History reviewed. No pertinent past medical history. History reviewed. No pertinent surgical history. Family History: History reviewed. No pertinent family history. Family Psychiatric  History: Denies Social History:  Social History   Substance and Sexual Activity  Alcohol Use Yes   Comment: occ     Social History   Substance and Sexual Activity  Drug Use Yes  . Types: IV, Methamphetamines, Marijuana    Social History   Socioeconomic History  . Marital status: Single    Spouse name: Not on file  . Number of children: Not on file  . Years of education: Not on file  . Highest education level: Not on file  Occupational History  . Not on file  Social Needs  . Financial resource strain: Not on file  . Food insecurity    Worry: Not on file    Inability: Not on file  . Transportation needs    Medical: Not on file    Non-medical: Not on file  Tobacco Use  . Smoking status: Current Every Day Smoker    Packs/day: 2.00    Types: Cigarettes  . Smokeless tobacco: Never Used  Substance and Sexual Activity  . Alcohol use: Yes    Comment: occ  . Drug use: Yes    Types: IV, Methamphetamines, Marijuana  . Sexual activity: Yes  Lifestyle  . Physical activity     Days per week: Not on file    Minutes per session: Not on file  . Stress: Not on file  Relationships  . Social Herbalist on phone: Not on file    Gets together: Not on file    Attends religious service: Not on file    Active member of club or organization: Not on file    Attends meetings of clubs or organizations: Not on file    Relationship status: Not on file  Other Topics Concern  . Not on file  Social History Narrative  . Not on file    Hospital Course:  From admission H&P: Patient is a 43 year old male with a past psychiatric history significant for methamphetamine dependence. Patient presented to the [facility] on [facility]/28/2019 with suicidal ideation and ongoing methamphetamine dependence. The patient had been using methamphetamines for approximately the last 20 years, but he has gotten involved with a woman who he loves, and she will not be with him unless he gets clean. He stated that he had been doing well with her, written remaining clean and working, but then relapsed since he and his girlfriend broke up. Unfortunately since then he is lost his job, had to sell his truck, and was at a homeless shelter but then left there a couple of days ago. He feels overwhelmed and feels like killing himself. Review of the electronic medical record revealed at least 2-3 emergency room visits in the last  several months secondary to his methamphetamine dependence. He has not been in rehab or detox for many years, and denied any charges or court dates coming forward. Review of his laboratories revealed no alcohol, but drug screen positive for methamphetamines as well as marijuana. He was admitted to the hospital for evaluation and stabilization.  Mr. Florencia ReasonsChilton was admitted for methamphetamine use disorder with suicidal ideation. [facility]. He was started on Remeron, trazodone, and Vistaril. He participated in group therapy on the unit. He  responded well to treatment with no adverse effects reported. He has shown improved mood, affect, sleep, and interaction. On day of discharge, he presents with euthymic affect and is future-oriented, eager to return home to his girlfriend and his job as a [employer]. He denies any SI/HI/AVH and contracts for safety. He denies withdrawal symptoms or cravings. He is discharging on the medications listed below. [facility] (see below). Patient is provided with prescriptions and medication samples upon discharge. His girlfriend is picking him up for discharge home.  Physical Findings: AIMS: Facial and Oral Movements Muscles of Facial Expression: None, normal Lips and Perioral Area: None, normal Jaw: None, normal Tongue: None, normal,Extremity Movements Upper (arms, wrists, hands, fingers): None, normal Lower (legs, knees, ankles, toes): None, normal, Trunk Movements Neck, shoulders, hips: None, normal, Overall Severity Severity of abnormal movements (highest score from questions above): None, normal Incapacitation due to abnormal movements: None, normal Patient's awareness of abnormal movements (rate only patient's report): No Awareness, Dental Status Current problems with teeth and/or dentures?: No Does patient usually wear dentures?: No  CIWA:  CIWA-Ar Total: 1 COWS:     Musculoskeletal: Strength & Muscle Tone: within normal limits Gait & Station: normal Patient leans: N/A  Psychiatric Specialty Exam: Physical Exam  Nursing note and vitals reviewed. Constitutional: He is oriented to person, place, and time. He appears well-developed and well-nourished.  Cardiovascular: Normal rate.  Respiratory: Effort normal.  Neurological: He is alert and oriented to person, place, and time.    Review of Systems  Constitutional: Negative.   Respiratory: Negative for cough and shortness of breath.   Cardiovascular: Negative for chest pain.   Psychiatric/Behavioral: Positive for depression (stable on medication) and substance abuse (meth, THC). Negative for hallucinations and suicidal ideas. The patient is not nervous/anxious and does not have insomnia.     Blood pressure 135/89, pulse 91, temperature 98 F (36.7 C), temperature source Oral, resp. rate 18, height 5\' 11"  (1.803 m), weight 82.1 kg, SpO2 97 %.Body mass index is 25.24 kg/m.  See MD's discharge SRA      Has this patient used any form of tobacco in the last 30 days? (Cigarettes, Smokeless Tobacco, Cigars, and/or Pipes) Yes, a prescription for an FDA-approved medication for tobacco cessation was offered at discharge.   Blood Alcohol level:  Lab Results  Component Value Date   ETH <10 08/28/2019   ETH <10 08/03/2019    Metabolic Disorder Labs:  No results found for: HGBA1C, MPG No results found for: PROLACTIN No results found for: CHOL, TRIG, HDL, CHOLHDL, VLDL, LDLCALC  See Psychiatric Specialty Exam and Suicide Risk Assessment completed by Attending Physician prior to discharge.  Discharge destination:  Home  Is patient on multiple antipsychotic therapies at discharge:  No   Has Patient had three or more failed trials of antipsychotic monotherapy by history:  No  Recommended Plan for Multiple Antipsychotic Therapies: NA  Discharge Instructions    Discharge instructions   Complete by: As directed    Patient is instructed to take all prescribed medications as recommended. Report any side effects or adverse reactions to your outpatient psychiatrist. Patient is instructed to abstain from alcohol and illegal drugs while on prescription medications. In the event of worsening symptoms, patient is instructed to call the crisis hotline, 911, or go to the nearest emergency department for evaluation and treatment.     Allergies as of 09/02/2019   No Known Allergies     Medication List    TAKE these medications     Indication  hydrOXYzine 25 MG  tablet Commonly known as: ATARAX/VISTARIL Take 1 tablet (25 mg total) by mouth 3 (three) times daily as needed for anxiety.  Indication: Feeling Anxious   mirtazapine 15 MG tablet Commonly known as: REMERON Take 1 tablet (15 mg total) by mouth at bedtime.  Indication: Major Depressive Disorder   nicotine 21 mg/24hr patch Commonly known as: NICODERM CQ - dosed in mg/24 hours Place 1 patch (21 mg total) onto the skin daily. Start taking on: September 03, 2019  Indication: Nicotine Addiction   traZODone 50 MG tablet Commonly known as: DESYREL Take 1 tablet (50 mg total) by mouth at bedtime as needed for sleep.  Indication: Trouble Sleeping      Follow-up [employer]. Go on 09/06/2019.   Why: Appointment for medication management is Friday, 09/06/19 at 11:30am. Please be sure to bring your Photo ID and any discharge paperwork, inclusing your list of medications. If you have any additional questions or concerns, please call the office.  Contact information: [address] [place], [place] [address]  P: [phone] F: [phone]       Services, [facility]. Go to.   Specialty: Behavioral Health Why: Walk-in hours for substance abuse therapy (SAIOP) is Monday-Friday from 8:00am-12:00pm and 1:00pm-3:00pm. Please be sure to call if you have any questions or concerns.  Contact information: [address] [place] [address] [phone]           Follow-up recommendations: Activity as tolerated. Diet as recommended by primary care physician. Keep all scheduled follow-up appointments as recommended.   Comments:   Patient is instructed to take all prescribed medications as recommended. Report any side effects or adverse reactions to your outpatient psychiatrist. Patient is instructed to abstain from alcohol and illegal drugs while on prescription medications. In the event of worsening symptoms, patient is instructed to call the crisis  hotline, 911, or go to the nearest emergency department for evaluation and treatment.  Signed: Aldean Baker, NP 09/02/2019, 11:07 AM

## 2019-09-02 NOTE — Progress Notes (Signed)
Recreation Therapy Notes  Date:  11.9.20 Time: 0930 Location: [address] Group Room  Group Topic: Stress Management  Goal Area(s) Addresses:  Patient will identify positive stress management techniques. Patient will identify benefits of using stress management post d/c.  Intervention:  Stress Management  Activity :  Meditation.  LRT introduced the stress management technique of meditation.  LRT played a meditation that focused on taking on the characteristics of a mountain.  Patients were to listen and follow as meditation was played to engage in activity.  Education:  Stress Management, Discharge Planning.   Education Outcome: Acknowledges Education  Clinical Observations/Feedback: Pt did not attend group.    Tevyn Codd, LRT/CTRS         Joellyn Grandt A 09/02/2019 11:20 AM 

## 2020-04-10 ENCOUNTER — Other Ambulatory Visit: Payer: Self-pay

## 2020-04-10 ENCOUNTER — Ambulatory Visit
Admission: EM | Admit: 2020-04-10 | Discharge: 2020-04-10 | Disposition: A | Discharge status: Home / Self Care | Payer: PRIVATE HEALTH INSURANCE | Attending: Emergency Medicine | Admitting: Emergency Medicine

## 2020-04-10 DIAGNOSIS — A630 Anogenital (venereal) warts: Secondary | ICD-10-CM

## 2020-04-10 DIAGNOSIS — B351 Tinea unguium: Secondary | ICD-10-CM | POA: Diagnosis not present

## 2020-04-10 HISTORY — DX: Other psychoactive substance abuse, uncomplicated: F19.10

## 2020-04-10 MED ORDER — CLOTRIMAZOLE 1 % EX CREA: TOPICAL_CREAM | CUTANEOUS | 0 refills | Status: DC

## 2020-04-10 MED ORDER — MICONAZOLE NITRATE 2 % EX POWD: CUTANEOUS | 0 refills | Status: DC | PRN

## 2020-04-10 NOTE — ED Provider Notes (Signed)
[facility]    CSN: [id number] Arrival date & time: 04/10/20  1201      History   Chief Complaint Chief Complaint  Patient presents with  . Foot Pain    HPI Daniel Hutchinson is a 44 y.o. male with history of methamphetamine dependence, MDD presenting for bilateral foot pain and toenail fungus/pruritus.  Patient states that he is dealt with this for several years due to wearing steel toed boots and working on concrete 6 days a week.  Patient denying discharge, swelling, injury, blisters.  Has used normal powder without relief. Patient also notes testicular lesion: No penile or testicular pain or swelling, discharge, urinary symptoms, abdominal pain, back pain, fever.  No bug bite, pruritus.   Past Medical History:  Diagnosis Date  . Substance abuse [facility])     Patient Active Problem List   Diagnosis Date Noted  . MDD (major depressive disorder) 08/29/2019  . Methamphetamine abuse ([place]) 08/03/2019  . Depression 08/03/2019  . History of suicidal ideation 08/03/2019  . Methamphetamine dependence, continuous ([place]) 08/03/2019    History reviewed. No pertinent surgical history.     Home Medications    Prior to Admission medications   Medication Sig Start Date End Date Taking? Authorizing Provider  clotrimazole (LOTRIMIN) 1 % cream Apply to affected area 2 times daily 04/10/20   Hall-Potvin, [place], PA-C  miconazole (MICOTIN) 2 % powder Apply topically as needed for itching. 04/10/20   Hall-Potvin, [place], PA-C  mirtazapine (REMERON) 15 MG tablet Take 1 tablet (15 mg total) by mouth at bedtime. 09/02/19 04/10/20  Connye Burkitt, NP  traZODone (DESYREL) 50 MG tablet Take 1 tablet (50 mg total) by mouth at bedtime as needed for sleep. 09/02/19 04/10/20  Connye Burkitt, NP    Family History Family History  Problem Relation Age of Onset  . Diabetes Mother   . Cancer Father   . Heart failure Father     Social History Social History   Tobacco Use  .  Smoking status: Current Every Day Smoker    Packs/day: 2.00    Types: Cigarettes  . Smokeless tobacco: Never Used  Vaping Use  . Vaping Use: Never used  Substance Use Topics  . Alcohol use: Yes    Comment: occ  . Drug use: Not Currently    Types: Methamphetamines, Marijuana    Comment: d/c use approx 3 years     Allergies   Patient has no known allergies.   Review of Systems As per HPI   Physical Exam Triage Vital Signs ED Triage Vitals  Enc Vitals Group     BP      Pulse      Resp      Temp      Temp src      SpO2      Weight      Height      Head Circumference      Peak Flow      Pain Score      Pain Loc      Pain Edu?      Excl. in [place]?    No data found.  Updated Vital Signs BP 138/81 (BP Location: Left Arm)   Pulse 94   Temp 98.2 F (36.8 C) (Oral)   Resp 18   SpO2 97%   Visual Acuity Right Eye Distance:   Left Eye Distance:   Bilateral Distance:    Right Eye Near:   Left Eye Near:  Bilateral Near:     Physical Exam Constitutional:      General: He is not in acute distress. HENT:     Head: Normocephalic and atraumatic.  Eyes:     General: No scleral icterus.    Pupils: Pupils are equal, round, and reactive to light.  Cardiovascular:     Rate and Rhythm: Normal rate.  Pulmonary:     Effort: Pulmonary effort is normal. No respiratory distress.     Breath sounds: No wheezing.  Genitourinary:    Comments: Condyloma acuminata noted to left scrotal wall Musculoskeletal:        General: No swelling or tenderness. Normal range of motion.  Skin:    Capillary Refill: Capillary refill takes less than 2 seconds.     Coloration: Skin is not jaundiced or pale.     Comments: Diffuse toenail fungus without surrounding erythema, open wound, discharge or warmth, tenderness.  Neurological:     Mental Status: He is alert and oriented to person, place, and time.      UC Treatments / Results  Labs (all labs ordered are listed, but only abnormal  results are displayed) Labs Reviewed - No data to display  EKG   Radiology No results found.  Procedures Procedures (including critical care time)  Medications Ordered in UC Medications - No data to display  Initial Impression / Assessment and Plan / UC Course  I have reviewed the triage vital signs and the nursing notes.  Pertinent labs & imaging results that were available during my care of the patient were reviewed by me and considered in my medical decision making (see chart for details).     Patient febrile, nontoxic in office today.  Low concern for secondary foot infection.  Will treat dental fungus as outlined below.  Will patient follow-up with PCP for further evaluation and management of condyloma.  Discussed the sex protocol.  Return precautions discussed, patient verbalized understanding and is agreeable to plan. Final Clinical Impressions(s) / UC Diagnoses   Final diagnoses:  Toenail fungus  Condyloma acuminata     Discharge Instructions     Important to establish care with PCP for routine health screenings. Apply cream to feet twice daily. May use powder to help keep areas dry. Follow-up with dermatology regarding skin lesion.    ED Prescriptions    Medication Sig Dispense Auth. Provider   clotrimazole (LOTRIMIN) 1 % cream Apply to affected area 2 times daily 15 g Hall-Potvin, [place], PA-C   miconazole (MICOTIN) 2 % powder Apply topically as needed for itching. 70 g Hall-Potvin, [place], PA-C     I have reviewed the PDMP during this encounter.   Hall-Potvin, [place], [place] 04/10/20 1404

## 2020-04-10 NOTE — Discharge Instructions (Signed)
Important to establish care with PCP for routine health screenings. Apply cream to feet twice daily. May use powder to help keep areas dry. Follow-up with dermatology regarding skin lesion.

## 2020-04-10 NOTE — ED Triage Notes (Signed)
Pt c/o bilat foot pain for several years and states that he works wearing steel-toed boots and works standing on concrete 6 days/week. Pt c/o red areas "that pop up" on top aspect of feet and then start bleeding frequently-denies blister formation to area; also c/o "fungus" to toenails.   Pt also concerned about a "mole" to left testicle that has raised/rough edges and dark brown in color; pt states that recent change has occurred in its appearance. States area is painful.

## 2020-06-03 ENCOUNTER — Ambulatory Visit
Admission: EM | Admit: 2020-06-03 | Discharge: 2020-06-03 | Disposition: A | Discharge status: Home / Self Care | Payer: PRIVATE HEALTH INSURANCE | Attending: Physician Assistant | Admitting: Physician Assistant

## 2020-06-03 DIAGNOSIS — M25561 Pain in right knee: Secondary | ICD-10-CM | POA: Diagnosis not present

## 2020-06-03 MED ORDER — DICLOFENAC SODIUM 50 MG PO TBEC: 50.0000 mg | DELAYED_RELEASE_TABLET | Freq: Two times a day (BID) | ORAL | 0 refills | Status: DC

## 2020-06-03 NOTE — ED Provider Notes (Signed)
[facility]    CSN: [id number] Arrival date & time: 06/03/20  0911      History   Chief Complaint Chief Complaint  Patient presents with  . Knee Pain    HPI Daniel Hutchinson is a 44 y.o. male.   44 year old male comes in for right knee pain after twisting and falling 3 days ago.  States pain is behind the patella, worse with range of motion and ambulation.  Denies swelling, erythema, warmth.  Has not taken anything for the symptoms.     Past Medical History:  Diagnosis Date  . Substance abuse [facility])     Patient Active Problem List   Diagnosis Date Noted  . MDD (major depressive disorder) 08/29/2019  . 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 Depression 08/03/2019  . History of suicidal ideation 08/03/2019  . 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 No pertinent surgical history.     Home Medications    Prior to Admission medications   Medication Sig Start Date End Date Taking? Authorizing Provider  diclofenac (VOLTAREN) 50 MG EC tablet Take 1 tablet (50 mg total) by mouth 2 (two) times daily. 06/03/20   Cathie Hoops, Lydia Toren V, PA-C  miconazole (MICOTIN) 2 % powder Apply topically as needed for itching. 04/10/20   Hall-Potvin, [place], PA-C  mirtazapine (REMERON) 15 MG tablet Take 1 tablet (15 mg total) by mouth at bedtime. 09/02/19 04/10/20  Aldean Baker, NP  traZODone (DESYREL) 50 MG tablet Take 1 tablet (50 mg total) by mouth at bedtime as needed for sleep. 09/02/19 04/10/20  Aldean Baker, NP    Family History Family History  Problem Relation Age of Onset  . Diabetes Mother   . Cancer Father   . Heart failure Father     Social History Social History   Tobacco Use  . Smoking status: Current Every Day Smoker    Packs/day: 2.00    Types: Cigarettes  . Smokeless tobacco: Never Used  Vaping Use  . Vaping Use: Never used  Substance Use Topics  . Alcohol use: Yes    Comment: occ  . Drug use: Not Currently     Types: Methamphetamines, Marijuana    Comment: d/c use approx 3 years     Allergies   Patient has no known allergies.   Review of Systems Review of Systems  Reason unable to perform ROS: See HPI as above.     Physical Exam Triage Vital Signs ED Triage Vitals  Enc Vitals Group     BP 06/03/20 0936 (!) 126/54     Pulse Rate 06/03/20 0936 89     Resp 06/03/20 0936 18     Temp 06/03/20 0936 97.9 F (36.6 C)     Temp Source 06/03/20 0936 Oral     SpO2 06/03/20 0936 98 %     Weight --      Height --      Head Circumference --      Peak Flow --      Pain Score 06/03/20 0956 6     Pain Loc --      Pain Edu? --      Excl. in GC? --    No data found.  Updated Vital Signs BP (!) 126/54 (BP Location: Left Arm)   Pulse 89   Temp 97.9 F (36.6 C) (Oral)   Resp 18   SpO2 98%   Physical Exam Constitutional:      General: He is  not in acute distress.    Appearance: Normal appearance. He is well-developed. He is not toxic-appearing or diaphoretic.  HENT:     Head: Normocephalic and atraumatic.  Eyes:     Conjunctiva/sclera: Conjunctivae normal.     Pupils: Pupils are equal, round, and reactive to light.  Pulmonary:     Effort: Pulmonary effort is normal. No respiratory distress.  Musculoskeletal:     Cervical back: Normal range of motion and neck supple.     Comments: No swelling, erythema, warmth, contusion. Diffuse tenderness to anterior knee without specific tenderness along joint lines. No posterior tenderness to palpation. Full ROM of knee. Strength 5/5. NVI  Skin:    General: Skin is warm and dry.  Neurological:     Mental Status: He is alert and oriented to person, place, and time.      UC Treatments / Results  Labs (all labs ordered are listed, but only abnormal results are displayed) Labs Reviewed - No data to display  EKG   Radiology No results found.  Procedures Procedures (including critical care time)  Medications Ordered in UC Medications  - No data to display  Initial Impression / Assessment and Plan / UC Course  I have reviewed the triage vital signs and the nursing notes.  Pertinent labs & imaging results that were available during my care of the patient were reviewed by me and considered in my medical decision making (see chart for details).    No indications for xray at this time. Will treat symptomatically with NSAIDs, ice compress, knee brace. To follow up with orthopedics/sports medicine if symptoms not improving. Return precautions given.   Final Clinical Impressions(s) / UC Diagnoses   Final diagnoses:  Acute pain of right knee   ED Prescriptions    Medication Sig Dispense Auth. Provider   diclofenac (VOLTAREN) 50 MG EC tablet Take 1 tablet (50 mg total) by mouth 2 (two) times daily. 20 tablet Belinda Fisher, PA-C     PDMP not reviewed this encounter.   Belinda Fisher, PA-C 06/03/20 1110

## 2020-06-03 NOTE — ED Triage Notes (Signed)
Pt c/o right knee pain 2/2 twisting it and falling onto the right knee approx 3 days ago. C/o pain to central patella and medial/lateral, and posterior aspects of knee. Also reports intermittent numbness/tingling to feet. Lower leg warm to touch, good color to skin. Pt able to ambulate. Pt tearful 2/2 "breaking up with my fiance".

## 2020-06-03 NOTE — Discharge Instructions (Signed)
Start Diclofenac. Do not take ibuprofen (motrin/advil)/ naproxen (aleve) while on diclofenac. Ice compress, knee brace during activity. Follow up with sports medicine/orthopedics if symptoms not improving.

## 2020-06-27 ENCOUNTER — Other Ambulatory Visit: Payer: Self-pay

## 2020-06-27 ENCOUNTER — Ambulatory Visit
Admission: EM | Admit: 2020-06-27 | Discharge: 2020-06-27 | Disposition: A | Discharge status: Home / Self Care | Payer: No Typology Code available for payment source | Attending: Physician Assistant | Admitting: Physician Assistant

## 2020-06-27 DIAGNOSIS — Z1152 Encounter for screening for COVID-19: Secondary | ICD-10-CM

## 2020-06-27 NOTE — Discharge Instructions (Signed)

## 2020-06-27 NOTE — ED Triage Notes (Signed)
Pt requesting covid testing. Denies any URI sx, fever, chills, SOB, n/v/d or other c/o. Denies exposure to COVID. States "[place] and [place] don't know what they have." Advised pt that CDC recommends not to test unless pt has symptoms and/o known significant exposure post 5 days. Pt states understanding but still insists on covid testing.

## 2020-06-28 LAB — NOVEL CORONAVIRUS, NAA: SARS-CoV-2, NAA: NOT DETECTED

## 2020-09-22 ENCOUNTER — Encounter (HOSPITAL_COMMUNITY): Payer: Self-pay

## 2020-09-22 ENCOUNTER — Other Ambulatory Visit: Payer: Self-pay

## 2020-09-22 ENCOUNTER — Observation Stay (HOSPITAL_COMMUNITY)
Admission: EM | Admit: 2020-09-22 | Discharge: 2020-09-23 | Disposition: A | Discharge status: Home / Self Care | Payer: Self-pay | Attending: Internal Medicine | Admitting: Internal Medicine

## 2020-09-22 ENCOUNTER — Emergency Department (HOSPITAL_COMMUNITY): Payer: Self-pay

## 2020-09-22 DIAGNOSIS — F17210 Nicotine dependence, cigarettes, uncomplicated: Secondary | ICD-10-CM | POA: Insufficient documentation

## 2020-09-22 DIAGNOSIS — N453 Epididymo-orchitis: Secondary | ICD-10-CM | POA: Diagnosis present

## 2020-09-22 DIAGNOSIS — D72829 Elevated white blood cell count, unspecified: Secondary | ICD-10-CM | POA: Insufficient documentation

## 2020-09-22 DIAGNOSIS — Z20822 Contact with and (suspected) exposure to covid-19: Secondary | ICD-10-CM | POA: Insufficient documentation

## 2020-09-22 DIAGNOSIS — N452 Orchitis: Secondary | ICD-10-CM | POA: Insufficient documentation

## 2020-09-22 DIAGNOSIS — A419 Sepsis, unspecified organism: Secondary | ICD-10-CM | POA: Diagnosis present

## 2020-09-22 DIAGNOSIS — N451 Epididymitis: Principal | ICD-10-CM | POA: Insufficient documentation

## 2020-09-22 LAB — CBC WITH DIFFERENTIAL/PLATELET
Abs Immature Granulocytes: 0.07 10*3/uL (ref 0.00–0.07)
Basophils Absolute: 0 10*3/uL (ref 0.0–0.1)
Basophils Relative: 0 %
Eosinophils Absolute: 0 10*3/uL (ref 0.0–0.5)
Eosinophils Relative: 0 %
HCT: 45.5 % (ref 39.0–52.0)
Hemoglobin: 15 g/dL (ref 13.0–17.0)
Immature Granulocytes: 1 %
Lymphocytes Relative: 4 %
Lymphs Abs: 0.5 10*3/uL — ABNORMAL LOW (ref 0.7–4.0)
MCH: 29.3 pg (ref 26.0–34.0)
MCHC: 33 g/dL (ref 30.0–36.0)
MCV: 88.9 fL (ref 80.0–100.0)
Monocytes Absolute: 1 10*3/uL (ref 0.1–1.0)
Monocytes Relative: 8 %
Neutro Abs: 10.8 10*3/uL — ABNORMAL HIGH (ref 1.7–7.7)
Neutrophils Relative %: 87 %
Platelets: 215 10*3/uL (ref 150–400)
RBC: 5.12 MIL/uL (ref 4.22–5.81)
RDW: 13 % (ref 11.5–15.5)
WBC: 12.4 10*3/uL — ABNORMAL HIGH (ref 4.0–10.5)
nRBC: 0 % (ref 0.0–0.2)

## 2020-09-22 LAB — COMPREHENSIVE METABOLIC PANEL
ALT: 27 U/L (ref 0–44)
AST: 27 U/L (ref 15–41)
Albumin: 4.1 g/dL (ref 3.5–5.0)
Alkaline Phosphatase: 65 U/L (ref 38–126)
Anion gap: 12 (ref 5–15)
BUN: 10 mg/dL (ref 6–20)
CO2: 23 mmol/L (ref 22–32)
Calcium: 8.9 mg/dL (ref 8.9–10.3)
Chloride: 96 mmol/L — ABNORMAL LOW (ref 98–111)
Creatinine, Ser: 1.2 mg/dL (ref 0.61–1.24)
GFR, Estimated: >60 mL/min (ref >60)
Glucose, Bld: 161 mg/dL — ABNORMAL HIGH (ref 70–99)
Potassium: 4.2 mmol/L (ref 3.5–5.1)
Sodium: 131 mmol/L — ABNORMAL LOW (ref 135–145)
Total Bilirubin: 0.6 mg/dL (ref 0.3–1.2)
Total Protein: 7.1 g/dL (ref 6.5–8.1)

## 2020-09-22 LAB — LACTIC ACID, PLASMA: Lactic Acid, Venous: 2.5 mmol/L (ref 0.5–1.9)

## 2020-09-22 LAB — LIPASE, BLOOD: Lipase: 25 U/L (ref 11–51)

## 2020-09-22 MED ORDER — LEVOFLOXACIN IN D5W 750 MG/150ML IV SOLN
750.0000 mg | Freq: Once | INTRAVENOUS | Status: AC
  Administered 2020-09-22: 750 mg via INTRAVENOUS
  Filled 2020-09-22: qty 150

## 2020-09-22 MED ORDER — LEVOFLOXACIN IN D5W 750 MG/150ML IV SOLN: 750.0000 mg | INTRAVENOUS | Status: DC

## 2020-09-22 MED ORDER — LACTATED RINGERS IV BOLUS
1000.0000 mL | Freq: Once | INTRAVENOUS | Status: AC
  Administered 2020-09-22: 1000 mL via INTRAVENOUS

## 2020-09-22 MED ORDER — HYDROMORPHONE HCL 1 MG/ML IJ SOLN
1.0000 mg | Freq: Once | INTRAMUSCULAR | Status: AC
  Administered 2020-09-22: 1 mg via INTRAVENOUS
  Filled 2020-09-22: qty 1

## 2020-09-22 MED ORDER — ONDANSETRON HCL 4 MG/2ML IJ SOLN
4.0000 mg | Freq: Once | INTRAMUSCULAR | Status: AC
  Administered 2020-09-22: 4 mg via INTRAVENOUS
  Filled 2020-09-22: qty 2

## 2020-09-22 NOTE — ED Provider Notes (Addendum)
[facility]-EMERGENCY DEPT Provider Note   CSN: [id number] Arrival date & time: 09/22/20  2102     History Chief Complaint  Patient presents with  . Testicle Pain    Daniel Hutchinson is a 44 y.o. male.  Presents to ER with concern for testicle pain.  States that he noted some mild pain over the weekend but it became severe today.  Sharp, stabbing, 10 out of 10, no alleviating factors.  Worse with certain movements.  No known injury.  Denies history of diabetes.  Had fever to 102 at home.  Broke up with his fiance a few months ago, no other sexual partners.  No sexual encounters since.  Was monogamous for many years with this person.  Denies prior history of STDs.  HPI     Past Medical History:  Diagnosis Date  . Substance abuse [facility])     Patient Active Problem List   Diagnosis Date Noted  . Sepsis (HCC) 09/23/2020  . Epididymoorchitis 09/22/2020  . MDD (major depressive disorder) 08/29/2019  . 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 Depression 08/03/2019  . History of suicidal ideation 08/03/2019  . 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 No pertinent surgical history.     Family History  Problem Relation Age of Onset  . Diabetes Mother   . Cancer Father   . Heart failure Father     Social History   Tobacco Use  . Smoking status: Current Every Day Smoker    Packs/day: 2.00    Types: Cigarettes  . Smokeless tobacco: Never Used  Vaping Use  . Vaping Use: Never used  Substance Use Topics  . Alcohol use: Yes    Comment: occ  . Drug use: Not Currently    Types: Methamphetamines, Marijuana    Comment: d/c use approx 3 years    Home Medications Prior to Admission medications   Medication Sig Start Date End Date Taking? Authorizing Provider  diclofenac (VOLTAREN) 50 MG EC tablet Take 1 tablet (50 mg total) by mouth 2 (two) times daily. Patient not taking: Reported on 09/22/2020 06/03/20   Belinda Fisher, PA-C  levofloxacin (LEVAQUIN) 500 MG tablet Take 1 tablet (500 mg total) by mouth daily for 10 days. 09/23/20 10/03/20  Milagros Loll, MD  miconazole (MICOTIN) 2 % powder Apply topically as needed for itching. Patient not taking: Reported on 09/22/2020 04/10/20   Hall-Potvin, [place], PA-C  mirtazapine (REMERON) 15 MG tablet Take 1 tablet (15 mg total) by mouth at bedtime. 09/02/19 04/10/20  Aldean Baker, NP  traZODone (DESYREL) 50 MG tablet Take 1 tablet (50 mg total) by mouth at bedtime as needed for sleep. 09/02/19 04/10/20  Aldean Baker, NP    Allergies    Patient has no known allergies.  Review of Systems   Review of Systems  Constitutional: Negative for chills and fever.  HENT: Negative for ear pain and sore throat.   Eyes: Negative for pain and visual disturbance.  Respiratory: Negative for cough and shortness of breath.   Cardiovascular: Negative for chest pain and palpitations.  Gastrointestinal: Negative for abdominal pain and vomiting.  Genitourinary: Positive for testicular pain. Negative for dysuria and hematuria.  Musculoskeletal: Negative for arthralgias and back pain.  Skin: Positive for color change. Negative for rash.  Neurological: Negative for seizures and syncope.  All other systems reviewed and are negative.   Physical Exam Updated Vital Signs BP (!) 136/101   Pulse (!) 107  Temp 98 F (36.7 C) (Oral)   Resp (!) 27   SpO2 96%   Physical Exam Vitals and nursing note reviewed.  Constitutional:      Appearance: He is well-developed.  HENT:     Head: Normocephalic and atraumatic.  Eyes:     Conjunctiva/sclera: Conjunctivae normal.  Cardiovascular:     Rate and Rhythm: Normal rate and regular rhythm.     Heart sounds: No murmur heard.   Pulmonary:     Effort: Pulmonary effort is normal. No respiratory distress.     Breath sounds: Normal breath sounds.  Abdominal:     Palpations: Abdomen is soft.     Tenderness: There is no abdominal  tenderness.  Genitourinary:    Comments: There is erythema over bilateral testicles, left scrotal swelling, tenderness to palpation of the left testicle, there is no crepitus, induration or fluctuance; no tenderness induration fluctuance or crepitus over the perineum Musculoskeletal:     Cervical back: Neck supple.     Comments: Mild ecchymosis over left mid, medial thigh, there is no crepitus or induration or fluctuance  Skin:    General: Skin is warm and dry.  Neurological:     Mental Status: He is alert.     ED Results / Procedures / Treatments   Labs (all labs ordered are listed, but only abnormal results are displayed) Labs Reviewed  CBC WITH DIFFERENTIAL/PLATELET - Abnormal; Notable for the following components:      Result Value   WBC 12.4 (*)    Neutro Abs 10.8 (*)    Lymphs Abs 0.5 (*)    All other components within normal limits  COMPREHENSIVE METABOLIC PANEL - Abnormal; Notable for the following components:   Sodium 131 (*)    Chloride 96 (*)    Glucose, Bld 161 (*)    All other components within normal limits  LACTIC ACID, PLASMA - Abnormal; Notable for the following components:   Lactic Acid, Venous 2.5 (*)    All other components within normal limits  URINE CULTURE  CULTURE, BLOOD (ROUTINE X 2)  CULTURE, BLOOD (ROUTINE X 2)  RESP PANEL BY RT-PCR (FLU A&B, COVID) ARPGX2  LIPASE, BLOOD  URINALYSIS, ROUTINE W REFLEX MICROSCOPIC  HEMOGLOBIN A1C  LACTIC ACID, PLASMA  RAPID URINE DRUG SCREEN, HOSP PERFORMED  HIV ANTIBODY (ROUTINE TESTING W REFLEX)  CBC  BASIC METABOLIC PANEL  GC/CHLAMYDIA PROBE AMP ([place]) NOT AT [facility]    EKG EKG Interpretation  Date/Time:  Tuesday September 22 2020 21:47:24 EST Ventricular Rate:  91 PR Interval:    QRS Duration: 81 QT Interval:  319 QTC Calculation: 393 R Axis:   44 Text Interpretation: Sinus rhythm RSR' in V1 or V2, right VCD or RVH Confirmed by Marianna Fuss ([id number]) on 09/22/2020 9:56:32 PM   Radiology US  SCROTUM W/DOPPLER  Result Date: 09/22/2020 CLINICAL DATA:  Left-sided scrotal pain for several days EXAM: SCROTAL ULTRASOUND DOPPLER ULTRASOUND OF THE TESTICLES TECHNIQUE: Complete ultrasound examination of the testicles, epididymis, and other scrotal structures was performed. Color and spectral Doppler ultrasound were also utilized to evaluate blood flow to the testicles. COMPARISON:  None. FINDINGS: Right testicle Measurements: 4.6 x 3.0 x 2.9 cm. No mass or microlithiasis visualized. Left testicle Measurements: 4.7 x 3.2 x 3.5 cm. No mass or microlithiasis visualized. Right epididymis:  6 mm right epididymal cyst is noted. Left epididymis:  Enlarged in size with increased vascularity. Hydrocele:  None visualized. Varicocele:  None visualized. Pulsed Doppler interrogation of both testes  demonstrates normal arterial and venous waveforms on the right. Increased vascularity on the left testicle is noted. IMPRESSION: Changes consistent with left epididymo-orchitis. Small right epididymal cyst. Electronically Signed   By: Alcide Clever M.D.   On: 09/22/2020 22:19    Procedures Procedures (including critical care time)  Medications Ordered in ED Medications  levofloxacin (LEVAQUIN) IVPB 750 mg (750 mg Intravenous New Bag/Given 09/22/20 2344)  levofloxacin (LEVAQUIN) IVPB 750 mg (has no administration in time range)  enoxaparin (LOVENOX) injection 40 mg (has no administration in time range)  acetaminophen (TYLENOL) tablet 650 mg (has no administration in time range)    Or  acetaminophen (TYLENOL) suppository 650 mg (has no administration in time range)  ondansetron (ZOFRAN) tablet 4 mg (has no administration in time range)    Or  ondansetron (ZOFRAN) injection 4 mg (has no administration in time range)  morphine 2 MG/ML injection 2 mg (has no administration in time range)  lactated ringers bolus 1,000 mL (0 mLs Intravenous Stopped 09/22/20 2330)  HYDROmorphone (DILAUDID) injection 1 mg (1 mg  Intravenous Given 09/22/20 2157)  ondansetron (ZOFRAN) injection 4 mg (4 mg Intravenous Given 09/22/20 2158)    ED Course  I have reviewed the triage vital signs and the nursing notes.  Pertinent labs & imaging results that were available during my care of the patient were reviewed by me and considered in my medical decision making (see chart for details).  Clinical Course as of Sep 23 16  Tue Sep 22, 2020  2136 Severe testicle pain, no frank signs of crepitus or induration to suggest necrotizing soft tissue infection, tachycardic, will check broad infectious work-up, start with ultrasound of testicles   [RD]  2150 Huntley Dec RN chaperone testicle exam   [RD]  2306 Symptoms markedly improved - given severity of symptoms, leukocytosis, will admit for iv abx and further obs   [RD]    Clinical Course User Index [RD] Milagros Loll, MD   MDM Rules/Calculators/A&P                         44 year old male presenting to ER with concern for severe left testicle pain.  Fever at home but afebrile here.  On exam noted swelling, redness of left testicle as well as some remote appearing ecchymosis over the left mid thigh.  No induration, fluctuance or crepitus over the perineum or scrotum to suggest necrotizing soft tissue infection.  Ultrasound was consistent with epididymoorchitis, which I suspect explains all of his presentation today.  Given the severity of pain, leukocytosis, mild elevation in lactic acid and his initial tachycardia, believe patient would benefit from IV antibiotics and further observation, pain control.  Will consult hospitalist service for admission.  Addendum was alerted by staff that patient wishes to leave.  I discussed in detail with patient my concerns for his infection and strong recommendations to stay for IV antibiotics and further observation, he has elected to leave against my medical advice.  Will provide Rx for oral Levaquin, stressed importance to return should he  change his mind or he has any worsening of his condition.  Final Clinical Impression(s) / ED Diagnoses Final diagnoses:  Epididymitis  Orchitis  Leukocytosis, unspecified type    Rx / DC Orders ED Discharge Orders         Ordered    levofloxacin (LEVAQUIN) 500 MG tablet  Daily        09/23/20 0015  Milagros Lollykstra, Marquel Pottenger S, MD 09/22/20 [phone]    Milagros Lollykstra, Salimata Christenson S, MD 09/23/20 [phone]

## 2020-09-22 NOTE — ED Triage Notes (Addendum)
Patient arrived stating he noticed a bruise on his left thigh and left testicle swelling on Saturday, tonight began having significant pain. No known injury. Began vomiting tonight.

## 2020-09-23 ENCOUNTER — Inpatient Hospital Stay (HOSPITAL_COMMUNITY)
Admission: EM | Admit: 2020-09-23 | Discharge: 2020-09-24 | DRG: 872 | Discharge status: Against Medical Advice | Payer: Self-pay | Attending: Internal Medicine | Admitting: Internal Medicine

## 2020-09-23 ENCOUNTER — Encounter (HOSPITAL_COMMUNITY): Payer: Self-pay | Admitting: Emergency Medicine

## 2020-09-23 ENCOUNTER — Other Ambulatory Visit: Payer: Self-pay

## 2020-09-23 ENCOUNTER — Observation Stay (HOSPITAL_COMMUNITY): Payer: Self-pay

## 2020-09-23 DIAGNOSIS — N492 Inflammatory disorders of scrotum: Secondary | ICD-10-CM

## 2020-09-23 DIAGNOSIS — Z20822 Contact with and (suspected) exposure to covid-19: Secondary | ICD-10-CM | POA: Diagnosis present

## 2020-09-23 DIAGNOSIS — Z8249 Family history of ischemic heart disease and other diseases of the circulatory system: Secondary | ICD-10-CM

## 2020-09-23 DIAGNOSIS — Z833 Family history of diabetes mellitus: Secondary | ICD-10-CM

## 2020-09-23 DIAGNOSIS — Z5329 Procedure and treatment not carried out because of patient's decision for other reasons: Secondary | ICD-10-CM | POA: Diagnosis not present

## 2020-09-23 DIAGNOSIS — Z720 Tobacco use: Secondary | ICD-10-CM | POA: Diagnosis present

## 2020-09-23 DIAGNOSIS — R0789 Other chest pain: Secondary | ICD-10-CM

## 2020-09-23 DIAGNOSIS — A419 Sepsis, unspecified organism: Principal | ICD-10-CM | POA: Diagnosis present

## 2020-09-23 DIAGNOSIS — Z809 Family history of malignant neoplasm, unspecified: Secondary | ICD-10-CM

## 2020-09-23 DIAGNOSIS — F17210 Nicotine dependence, cigarettes, uncomplicated: Secondary | ICD-10-CM | POA: Diagnosis present

## 2020-09-23 DIAGNOSIS — F1910 Other psychoactive substance abuse, uncomplicated: Secondary | ICD-10-CM | POA: Diagnosis present

## 2020-09-23 DIAGNOSIS — N453 Epididymo-orchitis: Secondary | ICD-10-CM | POA: Diagnosis present

## 2020-09-23 LAB — URINALYSIS, ROUTINE W REFLEX MICROSCOPIC
Bilirubin Urine: NEGATIVE
Glucose, UA: NEGATIVE mg/dL
Hgb urine dipstick: NEGATIVE
Ketones, ur: 20 mg/dL — AB
Nitrite: NEGATIVE
Protein, ur: 30 mg/dL — AB
Specific Gravity, Urine: 1.015 (ref 1.005–1.030)
WBC, UA: >50 WBC/hpf — ABNORMAL HIGH (ref 0–5)
pH: 7 (ref 5.0–8.0)

## 2020-09-23 LAB — RESP PANEL BY RT-PCR (FLU A&B, COVID) ARPGX2
Influenza A by PCR: NEGATIVE
Influenza B by PCR: NEGATIVE
SARS Coronavirus 2 by RT PCR: NEGATIVE

## 2020-09-23 LAB — TROPONIN I (HIGH SENSITIVITY)
Troponin I (High Sensitivity): 10 ng/L (ref <18)
Troponin I (High Sensitivity): 9 ng/L (ref <18)

## 2020-09-23 LAB — HEMOGLOBIN A1C
Hgb A1c MFr Bld: 5.4 % (ref 4.8–5.6)
Mean Plasma Glucose: 108.28 mg/dL

## 2020-09-23 LAB — HIV ANTIBODY (ROUTINE TESTING W REFLEX): HIV Screen 4th Generation wRfx: NONREACTIVE

## 2020-09-23 LAB — LACTIC ACID, PLASMA
Lactic Acid, Venous: 1.6 mmol/L (ref 0.5–1.9)
Lactic Acid, Venous: 2.1 mmol/L (ref 0.5–1.9)
Lactic Acid, Venous: 2.3 mmol/L (ref 0.5–1.9)

## 2020-09-23 MED ORDER — IBUPROFEN 400 MG PO TABS
400.0000 mg | ORAL_TABLET | Freq: Four times a day (QID) | ORAL | Status: DC | PRN
  Administered 2020-09-23 – 2020-09-24 (×2): 400 mg via ORAL
  Filled 2020-09-23 (×2): qty 1

## 2020-09-23 MED ORDER — ACETAMINOPHEN 325 MG PO TABS
650.0000 mg | ORAL_TABLET | ORAL | Status: DC | PRN
  Administered 2020-09-23: 650 mg via ORAL
  Filled 2020-09-23: qty 2

## 2020-09-23 MED ORDER — MORPHINE SULFATE (PF) 2 MG/ML IV SOLN: 2.0000 mg | INTRAVENOUS | Status: DC | PRN

## 2020-09-23 MED ORDER — LACTATED RINGERS IV SOLN: INTRAVENOUS | Status: DC

## 2020-09-23 MED ORDER — ACETAMINOPHEN 650 MG RE SUPP: 650.0000 mg | Freq: Four times a day (QID) | RECTAL | Status: DC | PRN

## 2020-09-23 MED ORDER — FENTANYL CITRATE (PF) 100 MCG/2ML IJ SOLN
50.0000 ug | Freq: Once | INTRAMUSCULAR | Status: AC
  Administered 2020-09-23: 50 ug via INTRAVENOUS
  Filled 2020-09-23: qty 2

## 2020-09-23 MED ORDER — LACTATED RINGERS IV SOLN: Freq: Once | INTRAVENOUS | Status: AC

## 2020-09-23 MED ORDER — ONDANSETRON HCL 4 MG/2ML IJ SOLN: 4.0000 mg | Freq: Four times a day (QID) | INTRAMUSCULAR | Status: DC | PRN

## 2020-09-23 MED ORDER — MORPHINE SULFATE (PF) 4 MG/ML IV SOLN
4.0000 mg | INTRAVENOUS | Status: DC | PRN
  Filled 2020-09-23: qty 1

## 2020-09-23 MED ORDER — LEVOFLOXACIN 500 MG PO TABS: 500.0000 mg | ORAL_TABLET | Freq: Every day | ORAL | 0 refills | Status: DC

## 2020-09-23 MED ORDER — OXYCODONE-ACETAMINOPHEN 5-325 MG PO TABS: 1.0000 | ORAL_TABLET | ORAL | Status: AC | PRN

## 2020-09-23 MED ORDER — LEVOFLOXACIN IN D5W 500 MG/100ML IV SOLN
500.0000 mg | Freq: Once | INTRAVENOUS | Status: AC
  Administered 2020-09-23: 500 mg via INTRAVENOUS
  Filled 2020-09-23: qty 100

## 2020-09-23 MED ORDER — ENOXAPARIN SODIUM 40 MG/0.4ML ~~LOC~~ SOLN
40.0000 mg | SUBCUTANEOUS | Status: DC
  Administered 2020-09-23: 40 mg via SUBCUTANEOUS
  Filled 2020-09-23: qty 0.4

## 2020-09-23 MED ORDER — LEVOFLOXACIN 500 MG PO TABS: 500.0000 mg | ORAL_TABLET | Freq: Every day | ORAL | 0 refills | Status: AC

## 2020-09-23 MED ORDER — ENOXAPARIN SODIUM 40 MG/0.4ML ~~LOC~~ SOLN: 40.0000 mg | SUBCUTANEOUS | Status: DC

## 2020-09-23 MED ORDER — LACTATED RINGERS IV BOLUS (SEPSIS)
1000.0000 mL | Freq: Once | INTRAVENOUS | Status: AC
  Administered 2020-09-23: 1000 mL via INTRAVENOUS

## 2020-09-23 MED ORDER — SODIUM CHLORIDE 0.9% FLUSH
3.0000 mL | Freq: Two times a day (BID) | INTRAVENOUS | Status: DC
  Administered 2020-09-24: 3 mL via INTRAVENOUS

## 2020-09-23 MED ORDER — LEVOFLOXACIN IN D5W 500 MG/100ML IV SOLN
500.0000 mg | INTRAVENOUS | Status: DC
  Administered 2020-09-24: 500 mg via INTRAVENOUS
  Filled 2020-09-23: qty 100

## 2020-09-23 MED ORDER — ONDANSETRON HCL 4 MG PO TABS: 4.0000 mg | ORAL_TABLET | Freq: Four times a day (QID) | ORAL | Status: DC | PRN

## 2020-09-23 MED ORDER — ONDANSETRON HCL 4 MG/2ML IJ SOLN
4.0000 mg | Freq: Four times a day (QID) | INTRAMUSCULAR | Status: DC | PRN
  Administered 2020-09-23 – 2020-09-24 (×3): 4 mg via INTRAVENOUS
  Filled 2020-09-23 (×4): qty 2

## 2020-09-23 MED ORDER — ALBUTEROL SULFATE (2.5 MG/3ML) 0.083% IN NEBU: 2.5000 mg | INHALATION_SOLUTION | Freq: Four times a day (QID) | RESPIRATORY_TRACT | Status: DC | PRN

## 2020-09-23 MED ORDER — ACETAMINOPHEN 325 MG PO TABS: 650.0000 mg | ORAL_TABLET | Freq: Four times a day (QID) | ORAL | Status: DC | PRN

## 2020-09-23 MED ORDER — OXYCODONE-ACETAMINOPHEN 5-325 MG PO TABS: 1.0000 | ORAL_TABLET | ORAL | Status: DC | PRN

## 2020-09-23 MED ORDER — MORPHINE SULFATE (PF) 4 MG/ML IV SOLN
4.0000 mg | INTRAVENOUS | Status: AC | PRN
  Administered 2020-09-23 – 2020-09-24 (×3): 4 mg via INTRAVENOUS
  Filled 2020-09-23 (×3): qty 1

## 2020-09-23 NOTE — Sepsis Progress Note (Signed)
Elink monitoring this Code Sepsis. 

## 2020-09-23 NOTE — ED Triage Notes (Signed)
Patient here with testicular pain.  Patient was seen at [facility] for same last night.  Patient is nauseated, no vomiting. No injury to testicles, does have a fever, redness and swelling to testicle.  Patient given of fentanyl.

## 2020-09-23 NOTE — ED Provider Notes (Signed)
[facility] EMERGENCY DEPARTMENT Provider Note   CSN: [id number] Arrival date & time: 09/23/20  0456     History Chief Complaint  Patient presents with  . Groin Swelling    Daniel Hutchinson is a 44 y.o. male.  The history is provided by the patient and medical records.   44 year old male presenting to the ED with testicle pain.  Seen at [facility] earlier tonight with full work-up and was you to be admitted for scrotal cellulitis along with epididymo-orchitis.  Patient states he did receive some of the IV antibiotics and apparently there was a disagreement with some of the staff members and he asked to be transferred out.  Reportedly he was told there was no an indication for that so he decided to leave.  States after he left pain worsened and now he is very uncomfortable.  He reports he is not opposed to being admitted for IV antibiotics but does not want to be at [facility].  EMS given fentanyl en route.  Past Medical History:  Diagnosis Date  . Substance abuse [facility])     Patient Active Problem List   Diagnosis Date Noted  . Sepsis (HCC) 09/23/2020  . Epididymoorchitis 09/22/2020  . MDD (major depressive disorder) 08/29/2019  . 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 Depression 08/03/2019  . History of suicidal ideation 08/03/2019  . 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 No pertinent surgical history.     Family History  Problem Relation Age of Onset  . Diabetes Mother   . Cancer Father   . Heart failure Father     Social History   Tobacco Use  . Smoking status: Current Every Day Smoker    Packs/day: 2.00    Types: Cigarettes  . Smokeless tobacco: Never Used  Vaping Use  . Vaping Use: Never used  Substance Use Topics  . Alcohol use: Yes    Comment: occ  . Drug use: Not Currently    Types: Methamphetamines, Marijuana    Comment: d/c use approx 3 years    Home Medications Prior to Admission medications     Medication Sig Start Date End Date Taking? Authorizing Provider  diclofenac (VOLTAREN) 50 MG EC tablet Take 1 tablet (50 mg total) by mouth 2 (two) times daily. Patient not taking: Reported on 09/22/2020 06/03/20   Belinda Fisher, PA-C  levofloxacin (LEVAQUIN) 500 MG tablet Take 1 tablet (500 mg total) by mouth daily for 10 days. 09/23/20 10/03/20  Milagros Loll, MD  miconazole (MICOTIN) 2 % powder Apply topically as needed for itching. Patient not taking: Reported on 09/22/2020 04/10/20   Hall-Potvin, [place], PA-C  mirtazapine (REMERON) 15 MG tablet Take 1 tablet (15 mg total) by mouth at bedtime. 09/02/19 04/10/20  Aldean Baker, NP  traZODone (DESYREL) 50 MG tablet Take 1 tablet (50 mg total) by mouth at bedtime as needed for sleep. 09/02/19 04/10/20  Aldean Baker, NP    Allergies    Patient has no known allergies.  Review of Systems   Review of Systems  Genitourinary: Positive for testicular pain.  All other systems reviewed and are negative.   Physical Exam Updated Vital Signs BP 126/73 (BP Location: Right Arm)   Pulse 97   Temp 98.7 F (37.1 C) (Oral)   Resp (!) 21   SpO2 98%   Physical Exam Vitals and nursing note reviewed.  Constitutional:      Appearance: He is well-developed.  Comments: Fidgeting, uncooperative with exam, refusing to answer questions multiple times  HENT:     Head: Normocephalic and atraumatic.  Eyes:     Conjunctiva/sclera: Conjunctivae normal.     Pupils: Pupils are equal, round, and reactive to light.  Cardiovascular:     Rate and Rhythm: Normal rate and regular rhythm.     Heart sounds: Normal heart sounds.  Pulmonary:     Effort: Pulmonary effort is normal. No respiratory distress.     Breath sounds: Normal breath sounds. No rhonchi.  Abdominal:     General: Bowel sounds are normal.     Palpations: Abdomen is soft.     Tenderness: There is no abdominal tenderness. There is no rebound.  Genitourinary:    Comments: Exam chaperoned by  RN Testicles erythematous bilaterally, left testicle swollen and tender to the touch, no tissue crepitus noted, normal lie, mild erythema of left medial thigh, no signs of necrosis or skin breakdown Musculoskeletal:        General: Normal range of motion.     Cervical back: Normal range of motion.  Skin:    General: Skin is warm and dry.  Neurological:     Mental Status: He is alert and oriented to person, place, and time.     ED Results / Procedures / Treatments   Labs (all labs ordered are listed, but only abnormal results are displayed) Labs Reviewed - No data to display  Results for orders placed or performed during the hospital encounter of 09/22/20  Resp Panel by RT-PCR (Flu A&B, Covid) Nasopharyngeal Swab   Specimen: Nasopharyngeal Swab; Nasopharyngeal(NP) swabs in vial transport medium  Result Value Ref Range   SARS Coronavirus 2 by RT PCR NEGATIVE NEGATIVE   Influenza A by PCR NEGATIVE NEGATIVE   Influenza B by PCR NEGATIVE NEGATIVE  CBC with Differential  Result Value Ref Range   WBC 12.4 (H) 4.0 - 10.5 K/uL   RBC 5.12 4.22 - 5.81 MIL/uL   Hemoglobin 15.0 13.0 - 17.0 g/dL   HCT 01.7 39 - 52 %   MCV 88.9 80.0 - 100.0 fL   MCH 29.3 26.0 - 34.0 pg   MCHC 33.0 30.0 - 36.0 g/dL   RDW 79.3 90.3 - 00.9 %   Platelets 215 150 - 400 K/uL   nRBC 0.0 0.0 - 0.2 %   Neutrophils Relative % 87 %   Neutro Abs 10.8 (H) 1.7 - 7.7 K/uL   Lymphocytes Relative 4 %   Lymphs Abs 0.5 (L) 0.7 - 4.0 K/uL   Monocytes Relative 8 %   Monocytes Absolute 1.0 0.1 - 1.0 K/uL   Eosinophils Relative 0 %   Eosinophils Absolute 0.0 0.0 - 0.5 K/uL   Basophils Relative 0 %   Basophils Absolute 0.0 0.0 - 0.1 K/uL   Immature Granulocytes 1 %   Abs Immature Granulocytes 0.07 0.00 - 0.07 K/uL  Comprehensive metabolic panel  Result Value Ref Range   Sodium 131 (L) 135 - 145 mmol/L   Potassium 4.2 3.5 - 5.1 mmol/L   Chloride 96 (L) 98 - 111 mmol/L   CO2 23 22 - 32 mmol/L   Glucose, Bld 161 (H) 70 -  99 mg/dL   BUN 10 6 - 20 mg/dL   Creatinine, Ser 2.33 0.61 - 1.24 mg/dL   Calcium 8.9 8.9 - 00.7 mg/dL   Total Protein 7.1 6.5 - 8.1 g/dL   Albumin 4.1 3.5 - 5.0 g/dL   AST 27 15 - 41  U/L   ALT 27 0 - 44 U/L   Alkaline Phosphatase 65 38 - 126 U/L   Total Bilirubin 0.6 0.3 - 1.2 mg/dL   GFR, Estimated >94 >80 mL/min   Anion gap 12 5 - 15  Lipase, blood  Result Value Ref Range   Lipase 25 11 - 51 U/L  Hemoglobin A1c  Result Value Ref Range   Hgb A1c MFr Bld 5.4 4.8 - 5.6 %   Mean Plasma Glucose 108.28 mg/dL  Lactic acid, plasma  Result Value Ref Range   Lactic Acid, Venous 2.5 (HH) 0.5 - 1.9 mmol/L     EKG None  Radiology US SCROTUM W/DOPPLER  Result Date: 09/22/2020 CLINICAL DATA:  Left-sided scrotal pain for several days EXAM: SCROTAL ULTRASOUND DOPPLER ULTRASOUND OF THE TESTICLES TECHNIQUE: Complete ultrasound examination of the testicles, epididymis, and other scrotal structures was performed. Color and spectral Doppler ultrasound were also utilized to evaluate blood flow to the testicles. COMPARISON:  None. FINDINGS: Right testicle Measurements: 4.6 x 3.0 x 2.9 cm. No mass or microlithiasis visualized. Left testicle Measurements: 4.7 x 3.2 x 3.5 cm. No mass or microlithiasis visualized. Right epididymis:  6 mm right epididymal cyst is noted. Left epididymis:  Enlarged in size with increased vascularity. Hydrocele:  None visualized. Varicocele:  None visualized. Pulsed Doppler interrogation of both testes demonstrates normal arterial and venous waveforms on the right. Increased vascularity on the left testicle is noted. IMPRESSION: Changes consistent with left epididymo-orchitis. Small right epididymal cyst. Electronically Signed   By: Alcide Clever M.D.   On: 09/22/2020 22:19    Procedures Procedures (including critical care time)  Medications Ordered in ED Medications  levofloxacin (LEVAQUIN) IVPB 500 mg (500 mg Intravenous New Bag/Given 09/23/20 0551)   oxyCODONE-acetaminophen (PERCOCET/ROXICET) 5-325 MG per tablet 1 tablet (has no administration in time range)    Or  morphine 4 MG/ML injection 4 mg (has no administration in time range)  lactated ringers infusion (has no administration in time range)  ondansetron (ZOFRAN) injection 4 mg (has no administration in time range)  acetaminophen (TYLENOL) tablet 650 mg (has no administration in time range)  fentaNYL (SUBLIMAZE) injection 50 mcg (50 mcg Intravenous Given 09/23/20 0537)    ED Course  I have reviewed the triage vital signs and the nursing notes.  Pertinent labs & imaging results that were available during my care of the patient were reviewed by me and considered in my medical decision making (see chart for details).    MDM Rules/Calculators/A&P  44 y.o. M here with testicle pain.  Full work-up just a few hours ago at [facility], set to be admitted to scrotal cellulitis/ epidiymo-orchitis but left AMA after altercation with staff in the ED.  He returns due to continued pain.  States he is not opposed to being admitted, just did not want to be at [facility].  Continues to have scrotal erythema and left testicular swelling.  No open wounds/sores, drainage, weeping.  No trouble urinating.  No tissue crepitus.  Will re-start his levaquin and admit for ongoing care.  Discussed with hospitalist, Dr. Leafy Half-- will admit for ongoing care.  Final Clinical Impression(s) / ED Diagnoses Final diagnoses:  Epididymo-orchitis, acute  Cellulitis, scrotum    Rx / DC Orders ED Discharge Orders    None       Garlon Hatchet, PA-C 09/23/20 1655    Shon Baton, MD 09/23/20 [phone]

## 2020-09-23 NOTE — ED Notes (Signed)
Pt awake and complaining of urgent need to pee. Also c/o of increased pain  Pt provided urinal and when RN returned with pain medication pt was asleep so pain medications held until pt request them

## 2020-09-23 NOTE — Discharge Instructions (Addendum)
We strongly recommended admission for IV antibiotics and further observation but you have chosen to leave AGAINST MEDICAL ADVICE.  Please take antibiotics as prescribed.  If you change your mind or if you have any worsening of your condition, return immediately to ER for reassessment.

## 2020-09-23 NOTE — ED Notes (Signed)
Pt requesting to leave and to have IV removed Pt still had IV abx being adminstered This Clinical research associate asked pt if he was sure that he wanted the IV removed because he needed to receive the full course of abx Pt insisted on IV removal This writer explained that he needed to finish the IV abx for treatment purposes but pt remained adamant about it being removed IV removed per pt request RN informed of same

## 2020-09-23 NOTE — Progress Notes (Signed)
CRITICAL VALUE ALERT  Critical Value:  2.1 Lactic Acid  Date & Time Notied:  09/23/2020, 14:13  Provider Notified: Clydie Braun, MD  Orders Received/Actions taken: awaiting orders

## 2020-09-23 NOTE — H&P (Addendum)
History and Physical    Daniel Hutchinson EYC:[id number] DOB: [birth year] DOA: 09/23/2020  Referring MD/NP/PA: Shauna Hugh, MD PCP: Patient, No Pcp Per  Patient coming from: [name], ED  Chief Complaint: "Pain in my nuts"  I have personally briefly reviewed patient's old medical records in [facility]   HPI: Daniel Hutchinson is a 44 y.o. male with medical history significant of polysubstance abuse including methamphetamine and marijuana presents with complaints of progressive worsening pain in his testicles started over the last 2-3 days.  He describes the pain as sharp, constant, and 10 out of 10 on the pain scale.  Pain is worsened with any movement.  Noted associated symptoms of swelling, fever, and chills at home.  Patient reports that he was in a monogamous relationship for years, but has not been sexually active in months since they broke.  Denies any dysuria, discharge, shortness of breath, chest pain, nausea, vomiting, or diarrhea.  He has never had a sexually transmitted disease to his knowledge.  He admits to smoking a pack of cigarettes per day on average and admits to marijuana use.  Denies any recent use of methamphetamines in a year.   ED Course: Patient had initially been worked up at [facility], but left and came to [facility] due to disagreement he had while there.  Patient had been seen to be afebrile, with pulse initially elevated up to 117, respirations up to 30, blood pressures 126/73-164/82, and O2 saturations maintained on room air.  Labs significant for WBC 12.4, sodium 131, and lactic acid 2.5.  Ultrasound revealed left epididymoorchitis.  Blood cultures have been obtained and patient was started on empiric antibiotics of Levaquin, fentanyl 50 mcg, and lactated Ringer's 125 mL/h.  Review of Systems  Constitutional: Positive for chills and fever.  HENT: Negative for ear discharge and nosebleeds.   Eyes: Negative for photophobia and  pain.  Respiratory: Negative for cough and shortness of breath.   Cardiovascular: Negative for chest pain and leg swelling.  Gastrointestinal: Negative for abdominal pain, nausea and vomiting.  Genitourinary: Negative for dysuria and frequency.       Positive for testicular swelling  Musculoskeletal: Negative for joint pain and myalgias.  Skin: Negative for itching.  Neurological: Negative for focal weakness and loss of consciousness.  Psychiatric/Behavioral: Positive for substance abuse. Negative for memory loss.    Past Medical History:  Diagnosis Date  . Substance abuse (HCC)     History reviewed. No pertinent surgical history.   reports that he has been smoking cigarettes. He has been smoking about 2.00 packs per day. He has never used smokeless tobacco. He reports current alcohol use. He reports previous drug use. Drugs: Methamphetamines and Marijuana.  No Known Allergies  Family History  Problem Relation Age of Onset  . Diabetes Mother   . Cancer Father   . Heart failure Father     Prior to Admission medications   Medication Sig Start Date End Date Taking? Authorizing Provider  diclofenac (VOLTAREN) 50 MG EC tablet Take 1 tablet (50 mg total) by mouth 2 (two) times daily. Patient not taking: Reported on 09/22/2020 06/03/20   Belinda Fisher, PA-C  levofloxacin (LEVAQUIN) 500 MG tablet Take 1 tablet (500 mg total) by mouth daily for 10 days. 09/23/20 10/03/20  Milagros Loll, MD  miconazole (MICOTIN) 2 % powder Apply topically as needed for itching. Patient not taking: Reported on 09/22/2020 04/10/20   Hall-Potvin, [place], PA-C  mirtazapine (REMERON)  15 MG tablet Take 1 tablet (15 mg total) by mouth at bedtime. 09/02/19 04/10/20  Aldean Baker, NP  traZODone (DESYREL) 50 MG tablet Take 1 tablet (50 mg total) by mouth at bedtime as needed for sleep. 09/02/19 04/10/20  Aldean Baker, NP    Physical Exam:  Constitutional: Middle-age male currently in no acute  distress. Vitals:   09/23/20 0632 09/23/20 0656 09/23/20 0751 09/23/20 0800  BP: 132/87  (!) 146/88 (!) 149/85  Pulse: 88  84 82  Resp: 19  20 18   Temp: 98.7 F (37.1 C)     TempSrc: Oral     SpO2: 99% 99% 94% 94%   Eyes: PERRL, lids and conjunctivae normal ENMT: Mucous membranes are dry. Posterior pharynx clear of any exudate or lesions.   Neck: normal, supple, no masses, no thyromegaly Respiratory: clear to auscultation bilaterally, no wheezing, no crackles. Normal respiratory effort. No accessory muscle use.  Cardiovascular: Regular rate and rhythm, no murmurs / rubs / gallops. No extremity edema. 2+ pedal pulses. No carotid bruits.  Abdomen: no tenderness, no masses palpated. No hepatosplenomegaly. Bowel sounds positive.  Musculoskeletal: Clubbing present of the bilateral hands. No joint deformity upper and lower extremities. Good ROM, no contractures. Normal muscle tone.  Skin: no rashes, lesions, ulcers. No induration Neurologic: CN 2-12 grossly intact. Sensation intact, DTR normal. Strength 5/5 in all 4.  Psychiatric: Normal judgment and insight.  Lethargic, but oriented x 3. Normal mood.     Labs on Admission: I have personally reviewed following labs and imaging studies  CBC: Recent Labs  Lab 09/22/20 2124  WBC 12.4*  NEUTROABS 10.8*  HGB 15.0  HCT 45.5  MCV 88.9  PLT 215   Basic Metabolic Panel: Recent Labs  Lab 09/22/20 2124  NA 131*  K 4.2  CL 96*  CO2 23  GLUCOSE 161*  BUN 10  CREATININE 1.20  CALCIUM 8.9   GFR: CrCl cannot be calculated (Unknown ideal weight.). Liver Function Tests: Recent Labs  Lab 09/22/20 2124  AST 27  ALT 27  ALKPHOS 65  BILITOT 0.6  PROT 7.1  ALBUMIN 4.1   Recent Labs  Lab 09/22/20 2124  LIPASE 25   No results for input(s): AMMONIA in the last 168 hours. Coagulation Profile: No results for input(s): INR, PROTIME in the last 168 hours. Cardiac Enzymes: No results for input(s): CKTOTAL, CKMB, CKMBINDEX,  TROPONINI in the last 168 hours. BNP (last 3 results) No results for input(s): PROBNP in the last 8760 hours. HbA1C: Recent Labs    09/22/20 2130  HGBA1C 5.4   CBG: No results for input(s): GLUCAP in the last 168 hours. Lipid Profile: No results for input(s): CHOL, HDL, LDLCALC, TRIG, CHOLHDL, LDLDIRECT in the last 72 hours. Thyroid Function Tests: No results for input(s): TSH, T4TOTAL, FREET4, T3FREE, THYROIDAB in the last 72 hours. Anemia Panel: No results for input(s): VITAMINB12, FOLATE, FERRITIN, TIBC, IRON, RETICCTPCT in the last 72 hours. Urine analysis: No results found for: COLORURINE, APPEARANCEUR, LABSPEC, PHURINE, GLUCOSEU, HGBUR, BILIRUBINUR, KETONESUR, PROTEINUR, UROBILINOGEN, NITRITE, LEUKOCYTESUR Sepsis Labs: Recent Results (from the past 240 hour(s))  Blood culture (routine x 2)     Status: None (Preliminary result)   Collection Time: 09/22/20  9:32 PM   Specimen: BLOOD  Result Value Ref Range Status   Specimen Description   Final    BLOOD RIGHT ANTECUBITAL Performed at [facility], [address] [place], [place]    Special Requests   Final  BOTTLES DRAWN AEROBIC AND ANAEROBIC Blood Culture adequate volume Performed at [facility], [address], [place], [place] [address]    Culture   Final    NO GROWTH < 12 HOURS Performed at [facility], [address] [place], [place] [address]    Report Status PENDING  Incomplete  Resp Panel by RT-PCR (Flu A&B, Covid) Nasopharyngeal Swab     Status: None   Collection Time: 09/22/20  9:33 PM   Specimen: Nasopharyngeal Swab; Nasopharyngeal(NP) swabs in vial transport medium  Result Value Ref Range Status   SARS Coronavirus 2 by RT PCR NEGATIVE NEGATIVE Final    Comment: (NOTE) SARS-CoV-2 target nucleic acids are NOT DETECTED.  The SARS-CoV-2 RNA is generally detectable in upper respiratory specimens during the acute phase of infection. The  lowest concentration of SARS-CoV-2 viral copies this assay can detect is 138 copies/mL. A negative result does not preclude SARS-Cov-2 infection and should not be used as the sole basis for treatment or other patient management decisions. A negative result may occur with  improper specimen collection/handling, submission of specimen other than nasopharyngeal swab, presence of viral mutation(s) within the areas targeted by this assay, and inadequate number of viral copies(<138 copies/mL). A negative result must be combined with clinical observations, patient history, and epidemiological information. The expected result is Negative.  Fact Sheet for Patients:  BloggerCourse.com  Fact Sheet for Healthcare Providers:  SeriousBroker.it  This test is no t yet approved or cleared by the [place] FDA and  has been authorized for detection and/or diagnosis of SARS-CoV-2 by FDA under an Emergency Use Authorization (EUA). This EUA will remain  in effect (meaning this test can be used) for the duration of the COVID-19 declaration under Section 564(b)(1) of the Act, 21 U.S.C.section 360bbb-3(b)(1), unless the authorization is terminated  or revoked sooner.       Influenza A by PCR NEGATIVE NEGATIVE Final   Influenza B by PCR NEGATIVE NEGATIVE Final    Comment: (NOTE) The Xpert Xpress SARS-CoV-2/FLU/RSV plus assay is intended as an aid in the diagnosis of influenza from Nasopharyngeal swab specimens and should not be used as a sole basis for treatment. Nasal washings and aspirates are unacceptable for Xpert Xpress SARS-CoV-2/FLU/RSV testing.  Fact Sheet for Patients: BloggerCourse.com  Fact Sheet for Healthcare Providers: SeriousBroker.it  This test is not yet approved or cleared by the [place] FDA and has been authorized for detection and/or diagnosis of SARS-CoV-2 by FDA under  an Emergency Use Authorization (EUA). This EUA will remain in effect (meaning this test can be used) for the duration of the COVID-19 declaration under Section 564(b)(1) of the Act, 21 U.S.C. section 360bbb-3(b)(1), unless the authorization is terminated or revoked.  Performed at [facility], [address] [place], [place] [address]   Blood culture (routine x 2)     Status: None (Preliminary result)   Collection Time: 09/22/20  9:37 PM   Specimen: BLOOD  Result Value Ref Range Status   Specimen Description   Final    BLOOD LEFT ANTECUBITAL Performed at [facility], [address] [place], [place] [address]    Special Requests   Final    BOTTLES DRAWN AEROBIC AND ANAEROBIC Blood Culture adequate volume Performed at [facility], [address], [place], [place] [address]    Culture   Final    NO GROWTH < 12 HOURS Performed at [facility], [address] [place], [place] [address]    Report Status PENDING  Incomplete     Radiological Exams on Admission: US SCROTUM W/DOPPLER  Result Date: 09/22/2020 CLINICAL DATA:  Left-sided scrotal pain for several days EXAM: SCROTAL ULTRASOUND DOPPLER ULTRASOUND OF THE TESTICLES TECHNIQUE: Complete ultrasound examination of the testicles, epididymis, and other scrotal structures was performed. Color and spectral Doppler ultrasound were also utilized to evaluate blood flow to the testicles. COMPARISON:  None. FINDINGS: Right testicle Measurements: 4.6 x 3.0 x 2.9 cm. No mass or microlithiasis visualized. Left testicle Measurements: 4.7 x 3.2 x 3.5 cm. No mass or microlithiasis visualized. Right epididymis:  6 mm right epididymal cyst is noted. Left epididymis:  Enlarged in size with increased vascularity. Hydrocele:  None visualized. Varicocele:  None visualized. Pulsed Doppler interrogation of both testes demonstrates normal arterial and venous waveforms on the right. Increased  vascularity on the left testicle is noted. IMPRESSION: Changes consistent with left epididymo-orchitis. Small right epididymal cyst. Electronically Signed   By: Alcide CleverMark  Lukens M.D.   On: 09/22/2020 22:19    EKG: Independently reviewed.  Sinus rhythm at 91 bpm with RSR in V1/2  Assessment/Plan  Sepsis secondary to epididymoorchitis: Acute.  Patient presented with tachycardia and tachypnea with WBC 12.4.  Lactic acid was seen to be elevated at 2.5.  Imaging studies consistent with left epididymoorchitis.  -Admit to a MedSurg bed -Check urinalysis  -Follow-up blood and urine cultures -Follow-up GC chlamydia probe -Continue Levaquin IV -Scrotal support -Pain control  -Trend lactic acid level -Recheck CBC in a.m.  Tobacco abuse: Patient reports smoking a pack cigarettes per day on average. -Counseled on the need of cessation of tobacco use -Nicotine patch offered  Polysubstance abuse: Patient denies any recent use of methamphetamines in over a year, but still admits to using marijuana. -Continue to counsel on the need of cessation of illicit drugs.  DVT prophylaxis: Lovenox Code Status: Full Family Communication: No family requested be updated Disposition Plan: Possibly discharge home in 1 to 2 days Consults called: None Admission status: Observation  Clydie Braunondell A Dorian Renfro MD [facility] Pager [phone]   If 7PM-7AM, please contact night-coverage [email or web address] Password [email or web address]  09/23/2020, 8:29 AM

## 2020-09-23 NOTE — Progress Notes (Signed)
Notified provider of need to order repeat lactic acid as the most recently drawn one was higher than the one prior.

## 2020-09-23 NOTE — TOC Initial Note (Addendum)
Transition of Care [facility]) - Initial/Assessment Note    Patient Details  Name: Nadeem Romanoski MRN: [id number] Date of Birth: [birth year]  Transition of Care [facility]) CM/SW Contact:    Kingsley Plan, RN Phone Number: 09/23/2020, 4:35 PM  Clinical Narrative:                 Spoke to patient at bedside. Patient currently staying with his ex sister in law and her boyfriend. Patient states he can return there. Address [facility]   Patient does not have a PCP. Scheduled follow up appointment with Jacob Moores, information on AVS.  Discussed TOC pharmacy and MATCH program , will assist with medications once discharge prescriptions determined. Per notes [facility] filled Levaquin script already.   Expected Discharge Plan: Home/Self Care Barriers to Discharge: Continued Medical Work up   Patient Goals and CMS Choice Patient states their goals for this hospitalization and ongoing recovery are:: to return to home CMS Medicare.gov Compare Post Acute Care list provided to:: Patient Choice offered to / list presented to : NA  Expected Discharge Plan and Services Expected Discharge Plan: Home/Self Care   Discharge Planning Services: CM Consult, MATCH Program, Medication Assistance, Indigent Health Clinic   Living arrangements for the past 2 months: Single Family Home                 DME Arranged: N/A DME Agency: NA       HH Arranged: NA          Prior Living Arrangements/Services Living arrangements for the past 2 months: Single Family Home Lives with:: Relatives Patient language and need for interpreter reviewed:: Yes Do you feel safe going back to the place where you live?: Yes      Need for Family Participation in Patient Care: Yes (Comment) Care giver support system in place?: Yes (comment)   Criminal Activity/Legal Involvement Pertinent to Current Situation/Hospitalization: No - Comment as needed  Activities of Daily Living      Permission Sought/Granted    Permission granted to share information with : No              Emotional Assessment Appearance:: Appears stated age Attitude/Demeanor/Rapport: Engaged Affect (typically observed): Accepting Orientation: : Oriented to Self, Oriented to Place, Oriented to  Time, Oriented to Situation Alcohol / Substance Use: Not Applicable Psych Involvement: No (comment)  Admission diagnosis:  Epididymoorchitis [N45.3] Cellulitis, scrotum [N49.2] Epididymo-orchitis, acute [N45.3] Patient Active Problem List   Diagnosis Date Noted  . Sepsis (HCC) 09/23/2020  . Tobacco abuse 09/23/2020  . Polysubstance abuse (HCC) 09/23/2020  . Epididymoorchitis 09/22/2020  . MDD (major depressive disorder) 08/29/2019  . 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 Depression 08/03/2019  . History of suicidal ideation 08/03/2019  . Methamphetamine dependence, continuous (HCC) 08/03/2019   PCP:  Patient, No Pcp Per Pharmacy:   [id number] - [place], [place] - [address] AT [facility] [address] [place] [address] Phone: [phone] Fax: [phone]     Social Determinants of Health (SDOH) Interventions    Readmission Risk Interventions No flowsheet data found.

## 2020-09-23 NOTE — ED Notes (Signed)
Daniel Hutchinson [phone] would like an update

## 2020-09-24 ENCOUNTER — Inpatient Hospital Stay (HOSPITAL_COMMUNITY): Payer: Self-pay

## 2020-09-24 ENCOUNTER — Other Ambulatory Visit: Payer: Self-pay

## 2020-09-24 ENCOUNTER — Encounter (HOSPITAL_COMMUNITY): Payer: Self-pay | Admitting: Emergency Medicine

## 2020-09-24 ENCOUNTER — Emergency Department (HOSPITAL_COMMUNITY)
Admission: EM | Admit: 2020-09-24 | Discharge: 2020-09-24 | Disposition: A | Discharge status: Home / Self Care | Payer: Medicaid Other | Attending: Emergency Medicine | Admitting: Emergency Medicine

## 2020-09-24 DIAGNOSIS — Z5321 Procedure and treatment not carried out due to patient leaving prior to being seen by health care provider: Secondary | ICD-10-CM | POA: Insufficient documentation

## 2020-09-24 DIAGNOSIS — R6520 Severe sepsis without septic shock: Secondary | ICD-10-CM

## 2020-09-24 DIAGNOSIS — A419 Sepsis, unspecified organism: Principal | ICD-10-CM

## 2020-09-24 DIAGNOSIS — N453 Epididymo-orchitis: Secondary | ICD-10-CM | POA: Insufficient documentation

## 2020-09-24 LAB — BASIC METABOLIC PANEL
Anion gap: 14 (ref 5–15)
Anion gap: 12 (ref 5–15)
BUN: 10 mg/dL (ref 6–20)
BUN: 16 mg/dL (ref 6–20)
CO2: 24 mmol/L (ref 22–32)
CO2: 27 mmol/L (ref 22–32)
Calcium: 9.3 mg/dL (ref 8.9–10.3)
Calcium: 9.4 mg/dL (ref 8.9–10.3)
Chloride: 96 mmol/L — ABNORMAL LOW (ref 98–111)
Chloride: 95 mmol/L — ABNORMAL LOW (ref 98–111)
Creatinine, Ser: 1.16 mg/dL (ref 0.61–1.24)
Creatinine, Ser: 1.35 mg/dL — ABNORMAL HIGH (ref 0.61–1.24)
GFR, Estimated: >60 mL/min (ref >60)
GFR, Estimated: >60 mL/min (ref >60)
Glucose, Bld: 88 mg/dL (ref 70–99)
Glucose, Bld: 104 mg/dL — ABNORMAL HIGH (ref 70–99)
Potassium: 4.2 mmol/L (ref 3.5–5.1)
Potassium: 3.9 mmol/L (ref 3.5–5.1)
Sodium: 134 mmol/L — ABNORMAL LOW (ref 135–145)
Sodium: 134 mmol/L — ABNORMAL LOW (ref 135–145)

## 2020-09-24 LAB — CBC
HCT: 50.6 % (ref 39.0–52.0)
HCT: 46.5 % (ref 39.0–52.0)
Hemoglobin: 16.6 g/dL (ref 13.0–17.0)
Hemoglobin: 15 g/dL (ref 13.0–17.0)
MCH: 28.6 pg (ref 26.0–34.0)
MCH: 28.5 pg (ref 26.0–34.0)
MCHC: 32.8 g/dL (ref 30.0–36.0)
MCHC: 32.3 g/dL (ref 30.0–36.0)
MCV: 87.2 fL (ref 80.0–100.0)
MCV: 88.4 fL (ref 80.0–100.0)
Platelets: 212 10*3/uL (ref 150–400)
Platelets: 256 10*3/uL (ref 150–400)
RBC: 5.8 MIL/uL (ref 4.22–5.81)
RBC: 5.26 MIL/uL (ref 4.22–5.81)
RDW: 13 % (ref 11.5–15.5)
RDW: 13 % (ref 11.5–15.5)
WBC: 15.8 10*3/uL — ABNORMAL HIGH (ref 4.0–10.5)
WBC: 17.6 10*3/uL — ABNORMAL HIGH (ref 4.0–10.5)
nRBC: 0 % (ref 0.0–0.2)
nRBC: 0 % (ref 0.0–0.2)

## 2020-09-24 LAB — LACTIC ACID, PLASMA: Lactic Acid, Venous: 1.3 mmol/L (ref 0.5–1.9)

## 2020-09-24 MED ORDER — KETOROLAC TROMETHAMINE 30 MG/ML IJ SOLN
30.0000 mg | Freq: Four times a day (QID) | INTRAMUSCULAR | Status: DC
  Administered 2020-09-24: 30 mg via INTRAVENOUS
  Filled 2020-09-24: qty 1

## 2020-09-24 MED ORDER — MORPHINE SULFATE (PF) 2 MG/ML IV SOLN: 2.0000 mg | INTRAVENOUS | Status: DC | PRN

## 2020-09-24 MED ORDER — LACTATED RINGERS IV SOLN: INTRAVENOUS | Status: DC

## 2020-09-24 MED ORDER — IOHEXOL 300 MG/ML  SOLN
100.0000 mL | Freq: Once | INTRAMUSCULAR | Status: AC | PRN
  Administered 2020-09-24: 100 mL via INTRAVENOUS

## 2020-09-24 NOTE — Progress Notes (Signed)
Patient requesting to leave AMA. Doctor aware and has spoken with the patient. Charge nurse made aware.

## 2020-09-24 NOTE — ED Triage Notes (Signed)
Patient arrives to ED wanting to be re admitted for epididymo-orchitis and cellulitis of the scrotum. Pt left AMA today because he did not feel as if he was being cared for. Now, pt states that his symptoms of testicular pain are worse.

## 2020-09-24 NOTE — Progress Notes (Signed)
Patient left AMA at 1400

## 2020-09-24 NOTE — ED Notes (Signed)
Called for repeat VS, no response. 

## 2020-09-24 NOTE — Discharge Summary (Signed)
Physician Discharge Summary  Daniel Hutchinson ZDG:[id number] DOB: 1976-09-02 DOA: 09/23/2020  PCP: Pcp, No  Admit date: 09/23/2020 Discharge date: 09/24/2020  PATIENT LEFT THE HOSPITAL AGAINST MEDICAL ADVICE  Brief/Interim Summary: 44 year old male, admitted to the hospital with testicular pain and swelling.  Found to have epididymoorchitis.  Patient denied having multiple sexual partners.  He was started on treatment with levofloxacin.  Urinalysis indicated possible infection.  Urine culture was ordered.  He was noted to have elevated WBC count.  The day after admission, patient was very insistent on being discharged from the hospital.  Although he did report some improvement in his discomfort, he felt as though he could receive better care at [facility]  He was advised that he would likely receive the same treatment with intravenous antibiotics.  CT scan of the pelvis did not reveal any underlying abscess or gas formation.  Despite recommendations to stay in the hospital, patient was very adamant about leaving the hospital.  He was advised to continue on oral levofloxacin that was prescribed to him on his previous ER visit.  Patient left the hospital AGAINST MEDICAL ADVICE.  Discharge Diagnoses:  Principal Problem:   Sepsis (HCC) Active Problems:   Epididymoorchitis   Tobacco abuse   Polysubstance abuse (HCC)    Discharge Instructions   Allergies as of 09/24/2020   No Known Allergies     Medication List    ASK your doctor about these medications   diclofenac 50 MG EC tablet Commonly known as: VOLTAREN Take 1 tablet (50 mg total) by mouth 2 (two) times daily.   levofloxacin 500 MG tablet Commonly known as: LEVAQUIN Take 1 tablet (500 mg total) by mouth daily for 10 days.   miconazole 2 % powder Commonly known as: MICOTIN Apply topically as needed for itching.       Follow-up Information    Grayce Sessions, NP Follow up.   Specialty: Internal Medicine Why:  October 08, 2020 at 0930 am  Contact information: [address] [place] [address] [phone]              No Known Allergies  Consultations:     Procedures/Studies: CT PELVIS W CONTRAST  Result Date: 09/24/2020 CLINICAL DATA:  Scrotal swelling or edema. EXAM: CT PELVIS WITH CONTRAST TECHNIQUE: Multidetector CT imaging of the pelvis was performed using the standard protocol following the bolus administration of intravenous contrast. CONTRAST:  OMNIPAQUE IOHEXOL 300 MG/ML  SOLN COMPARISON:  None. FINDINGS: Urinary Tract:  Urinary bladder appears within normal limits. Bowel:  Unremarkable visualized pelvic bowel loops. Vascular/Lymphatic: No significant vascular abnormality identified. Prominent enhancing retroperitoneal, iliac and inguinal lymph nodes are likely reactive. The largest node is in the right common iliac chain measuring 1 cm, image 42/5. Reproductive:  The prostate gland appears normal. Other: There is skin thickening and subcutaneous edema involving the scrotum and penis compatible with cellulitis. There is asymmetric enlargement of the left epididymis and left testis with hyperenhancement and increased vascularity compatible with epididymal orchitis. Asymmetric edema and hyperenhancement is noted along the length of the left inguinal canal. No discrete fluid collection identified to suggest abscess. No gas identified within the scrotum. Musculoskeletal: No suspicious bone lesions identified. IMPRESSION: 1. Imaging findings compatible with marked cellulitis involving the scrotum and penis. No discrete fluid collection identified to suggest abscess. No gas identified within the scrotum. 2. Asymmetric enhancement and enlargement of the left epididymis and testis with edema and hyperenhancement of the left inguinal canal compatible with  epididymo-orchitis 3. Prominent pelvic lymph nodes are likely reactive. Electronically Signed   By: Signa Kell M.D.   On:  09/24/2020 14:09   DG CHEST PORT 1 VIEW  Result Date: 09/23/2020 CLINICAL DATA:  Chest tightness with nausea and vomiting EXAM: PORTABLE CHEST 1 VIEW COMPARISON:  None. FINDINGS: There is mild bibasilar atelectasis. The lungs elsewhere are clear. Heart is upper normal in size with pulmonary vascularity normal. No adenopathy. No pneumothorax. No bone lesions. IMPRESSION: Slight bibasilar atelectasis. No edema or airspace opacity. Heart upper normal in size. Electronically Signed   By: Bretta Bang III M.D.   On: 09/23/2020 12:19   US SCROTUM W/DOPPLER  Result Date: 09/22/2020 CLINICAL DATA:  Left-sided scrotal pain for several days EXAM: SCROTAL ULTRASOUND DOPPLER ULTRASOUND OF THE TESTICLES TECHNIQUE: Complete ultrasound examination of the testicles, epididymis, and other scrotal structures was performed. Color and spectral Doppler ultrasound were also utilized to evaluate blood flow to the testicles. COMPARISON:  None. FINDINGS: Right testicle Measurements: 4.6 x 3.0 x 2.9 cm. No mass or microlithiasis visualized. Left testicle Measurements: 4.7 x 3.2 x 3.5 cm. No mass or microlithiasis visualized. Right epididymis:  6 mm right epididymal cyst is noted. Left epididymis:  Enlarged in size with increased vascularity. Hydrocele:  None visualized. Varicocele:  None visualized. Pulsed Doppler interrogation of both testes demonstrates normal arterial and venous waveforms on the right. Increased vascularity on the left testicle is noted. IMPRESSION: Changes consistent with left epididymo-orchitis. Small right epididymal cyst. Electronically Signed   By: Alcide Clever M.D.   On: 09/22/2020 22:19       Subjective: Reports that testicular pain is still present although mildly better than yesterday  Discharge Exam: Vitals:   09/23/20 1747 09/23/20 2144 09/24/20 0212 09/24/20 0551  BP: 132/80 117/72 121/82 113/70  Pulse: 79 96 72 100  Resp: 20 20 18 17   Temp: 99.5 F (37.5 C) 98.2 F (36.8 C) 98.4  F (36.9 C) 98.9 F (37.2 C)  TempSrc: Oral     SpO2: 100% 98% 98% 98%  Weight:      Height:        General: Pt is alert, awake, not in acute distress Cardiovascular: RRR, S1/S2 +, no rubs, no gallops Respiratory: CTA bilaterally, no wheezing, no rhonchi Abdominal: Soft, NT, ND, bowel sounds + Extremities: no edema, no cyanosis    The results of significant diagnostics from this hospitalization (including imaging, microbiology, ancillary and laboratory) are listed below for reference.     Microbiology: Recent Results (from the past 240 hour(s))  Blood culture (routine x 2)     Status: None (Preliminary result)   Collection Time: 09/22/20  9:32 PM   Specimen: BLOOD  Result Value Ref Range Status   Specimen Description   Final    BLOOD RIGHT ANTECUBITAL Performed at [facility], [address] [place], [place]    Special Requests   Final    BOTTLES DRAWN AEROBIC AND ANAEROBIC Blood Culture adequate volume Performed at [facility], [address], [place], [place]    Culture   Final    NO GROWTH 2 DAYS Performed at [facility], [address] [place], [place]    Report Status PENDING  Incomplete  Resp Panel by RT-PCR (Flu A&B, Covid) Nasopharyngeal Swab     Status: None   Collection Time: 09/22/20  9:33 PM   Specimen: Nasopharyngeal Swab; Nasopharyngeal(NP) swabs in vial transport medium  Result  Value Ref Range Status   SARS Coronavirus 2 by RT PCR NEGATIVE NEGATIVE Final    Comment: (NOTE) SARS-CoV-2 target nucleic acids are NOT DETECTED.  The SARS-CoV-2 RNA is generally detectable in upper respiratory specimens during the acute phase of infection. The lowest concentration of SARS-CoV-2 viral copies this assay can detect is 138 copies/mL. A negative result does not preclude SARS-Cov-2 infection and should not be used as the sole basis for treatment or other patient management decisions. A  negative result may occur with  improper specimen collection/handling, submission of specimen other than nasopharyngeal swab, presence of viral mutation(s) within the areas targeted by this assay, and inadequate number of viral copies(<138 copies/mL). A negative result must be combined with clinical observations, patient history, and epidemiological information. The expected result is Negative.  Fact Sheet for Patients:  BloggerCourse.comhttps://www.fda.gov/media/152166/download  Fact Sheet for Healthcare Providers:  SeriousBroker.ithttps://www.fda.gov/media/152162/download  This test is no t yet approved or cleared by the [place] FDA and  has been authorized for detection and/or diagnosis of SARS-CoV-2 by FDA under an Emergency Use Authorization (EUA). This EUA will remain  in effect (meaning this test can be used) for the duration of the COVID-19 declaration under Section 564(b)(1) of the Act, 21 U.S.C.section 360bbb-3(b)(1), unless the authorization is terminated  or revoked sooner.       Influenza A by PCR NEGATIVE NEGATIVE Final   Influenza B by PCR NEGATIVE NEGATIVE Final    Comment: (NOTE) The Xpert Xpress SARS-CoV-2/FLU/RSV plus assay is intended as an aid in the diagnosis of influenza from Nasopharyngeal swab specimens and should not be used as a sole basis for treatment. Nasal washings and aspirates are unacceptable for Xpert Xpress SARS-CoV-2/FLU/RSV testing.  Fact Sheet for Patients: BloggerCourse.comhttps://www.fda.gov/media/152166/download  Fact Sheet for Healthcare Providers: SeriousBroker.ithttps://www.fda.gov/media/152162/download  This test is not yet approved or cleared by the [place] FDA and has been authorized for detection and/or diagnosis of SARS-CoV-2 by FDA under an Emergency Use Authorization (EUA). This EUA will remain in effect (meaning this test can be used) for the duration of the COVID-19 declaration under Section 564(b)(1) of the Act, 21 U.S.C. section 360bbb-3(b)(1), unless the authorization  is terminated or revoked.  Performed at [facility], [address] [place], [place] [address]   Blood culture (routine x 2)     Status: None (Preliminary result)   Collection Time: 09/22/20  9:37 PM   Specimen: BLOOD  Result Value Ref Range Status   Specimen Description   Final    BLOOD LEFT ANTECUBITAL Performed at [facility], [address] [place], [place] [address]    Special Requests   Final    BOTTLES DRAWN AEROBIC AND ANAEROBIC Blood Culture adequate volume Performed at [facility], [address] [place], [place] [address]    Culture   Final    NO GROWTH 2 DAYS Performed at [facility], [address] [place], [place] [address]    Report Status PENDING  Incomplete     Labs: BNP (last 3 results) No results for input(s): BNP in the last 8760 hours. Basic Metabolic Panel: Recent Labs  Lab 09/22/20 2124 09/24/20 0346 09/24/20 1650  NA 131* 134* 134*  K 4.2 4.2 3.9  CL 96* 96* 95*  CO2 23 24 27   GLUCOSE 161* 88 104*  BUN 10 10 16   CREATININE 1.20 1.16 1.35*  CALCIUM 8.9 9.3 9.4   Liver Function Tests: Recent Labs  Lab 09/22/20 2124  AST 27  ALT 27  ALKPHOS 65  BILITOT 0.6  PROT 7.1  ALBUMIN 4.1   Recent Labs  Lab 09/22/20 2124  LIPASE 25   No results for input(s): AMMONIA in the last 168 hours. CBC: Recent Labs  Lab 09/22/20 2124 09/24/20 0346 09/24/20 1650  WBC 12.4* 15.8* 17.6*  NEUTROABS 10.8*  --   --   HGB 15.0 16.6 15.0  HCT 45.5 50.6 46.5  MCV 88.9 87.2 88.4  PLT 215 212 256   Cardiac Enzymes: No results for input(s): CKTOTAL, CKMB, CKMBINDEX, TROPONINI in the last 168 hours. BNP: Invalid input(s): POCBNP CBG: No results for input(s): GLUCAP in the last 168 hours. D-Dimer No results for input(s): DDIMER in the last 72 hours. Hgb A1c Recent Labs    09/22/20 2130  HGBA1C 5.4   Lipid Profile No results for input(s): CHOL, HDL, LDLCALC, TRIG, CHOLHDL,  LDLDIRECT in the last 72 hours. Thyroid function studies No results for input(s): TSH, T4TOTAL, T3FREE, THYROIDAB in the last 72 hours.  Invalid input(s): FREET3 Anemia work up No results for input(s): VITAMINB12, FOLATE, FERRITIN, TIBC, IRON, RETICCTPCT in the last 72 hours. Urinalysis    Component Value Date/Time   COLORURINE YELLOW 09/23/2020 0840   APPEARANCEUR CLEAR 09/23/2020 0840   LABSPEC 1.015 09/23/2020 0840   PHURINE 7.0 09/23/2020 0840   GLUCOSEU NEGATIVE 09/23/2020 0840   HGBUR NEGATIVE 09/23/2020 0840   BILIRUBINUR NEGATIVE 09/23/2020 0840   KETONESUR 20 (A) 09/23/2020 0840   PROTEINUR 30 (A) 09/23/2020 0840   NITRITE NEGATIVE 09/23/2020 0840   LEUKOCYTESUR SMALL (A) 09/23/2020 0840   Sepsis Labs Invalid input(s): PROCALCITONIN,  WBC,  LACTICIDVEN Microbiology Recent Results (from the past 240 hour(s))  Blood culture (routine x 2)     Status: None (Preliminary result)   Collection Time: 09/22/20  9:32 PM   Specimen: BLOOD  Result Value Ref Range Status   Specimen Description   Final    BLOOD RIGHT ANTECUBITAL Performed at [facility], [address] [place], [place] [address]    Special Requests   Final    BOTTLES DRAWN AEROBIC AND ANAEROBIC Blood Culture adequate volume Performed at [facility], [address], [place], [place] [address]    Culture   Final    NO GROWTH 2 DAYS Performed at [facility], [address] [place], [place] [address]    Report Status PENDING  Incomplete  Resp Panel by RT-PCR (Flu A&B, Covid) Nasopharyngeal Swab     Status: None   Collection Time: 09/22/20  9:33 PM   Specimen: Nasopharyngeal Swab; Nasopharyngeal(NP) swabs in vial transport medium  Result Value Ref Range Status   SARS Coronavirus 2 by RT PCR NEGATIVE NEGATIVE Final    Comment: (NOTE) SARS-CoV-2 target nucleic acids are NOT DETECTED.  The SARS-CoV-2 RNA is generally detectable in upper respiratory specimens  during the acute phase of infection. The lowest concentration of SARS-CoV-2 viral copies this assay can detect is 138 copies/mL. A negative result does not preclude SARS-Cov-2 infection and should not be used as the sole basis for treatment or other patient management decisions. A negative result may occur with  improper specimen collection/handling, submission of specimen other than nasopharyngeal swab, presence of viral mutation(s) within the areas targeted by this assay, and inadequate number of viral copies(<138 copies/mL). A negative result must be combined with clinical observations, patient history, and epidemiological information. The expected result is Negative.  Fact Sheet for Patients:  BloggerCourse.com  Fact Sheet for Healthcare Providers:  SeriousBroker.it  This test is no t yet approved or cleared by the [place] FDA and  has been authorized for detection and/or diagnosis of SARS-CoV-2 by FDA under an Emergency Use Authorization (EUA). This EUA will remain  in effect (meaning this test can be used) for the duration of the COVID-19 declaration under Section 564(b)(1) of the Act, 21 U.S.C.section 360bbb-3(b)(1), unless the authorization is terminated  or revoked sooner.       Influenza A by PCR NEGATIVE NEGATIVE Final   Influenza B by PCR NEGATIVE NEGATIVE Final    Comment: (NOTE) The Xpert Xpress SARS-CoV-2/FLU/RSV plus assay is intended as an aid in the diagnosis of influenza from Nasopharyngeal swab specimens and should not be used as a sole basis for treatment. Nasal washings and aspirates are unacceptable for Xpert Xpress SARS-CoV-2/FLU/RSV testing.  Fact Sheet for Patients: BloggerCourse.com  Fact Sheet for Healthcare Providers: SeriousBroker.it  This test is not yet approved or cleared by the [place] FDA and has been authorized for detection  and/or diagnosis of SARS-CoV-2 by FDA under an Emergency Use Authorization (EUA). This EUA will remain in effect (meaning this test can be used) for the duration of the COVID-19 declaration under Section 564(b)(1) of the Act, 21 U.S.C. section 360bbb-3(b)(1), unless the authorization is terminated or revoked.  Performed at [facility], [address] [place], [place] [address]   Blood culture (routine x 2)     Status: None (Preliminary result)   Collection Time: 09/22/20  9:37 PM   Specimen: BLOOD  Result Value Ref Range Status   Specimen Description   Final    BLOOD LEFT ANTECUBITAL Performed at [facility], [address], [place], [place] [address]    Special Requests   Final    BOTTLES DRAWN AEROBIC AND ANAEROBIC Blood Culture adequate volume Performed at [facility], [address], [place], [place] [address]    Culture   Final    NO GROWTH 2 DAYS Performed at [facility], [address] [place], [place] [address]    Report Status PENDING  Incomplete     Time coordinating discharge:  SIGNED:   Erick Blinks, MD  [facility] 09/24/2020, 11:19 PM   If 7PM-7AM, please contact night-coverage [email or web address]

## 2020-09-24 NOTE — ED Notes (Signed)
Called to room pt, no response. 

## 2020-09-27 LAB — CULTURE, BLOOD (ROUTINE X 2)
Culture: NO GROWTH
Culture: NO GROWTH
Special Requests: ADEQUATE
Special Requests: ADEQUATE

## 2020-10-08 ENCOUNTER — Inpatient Hospital Stay (INDEPENDENT_AMBULATORY_CARE_PROVIDER_SITE_OTHER): Payer: Medicaid Other | Admitting: Primary Care

## 2022-04-03 ENCOUNTER — Encounter: Payer: Self-pay | Admitting: Urgent Care

## 2022-04-03 ENCOUNTER — Ambulatory Visit
Admission: EM | Admit: 2022-04-03 | Discharge: 2022-04-03 | Disposition: A | Discharge status: Home / Self Care | Payer: Medicaid Other | Attending: Urgent Care | Admitting: Urgent Care

## 2022-04-03 DIAGNOSIS — J209 Acute bronchitis, unspecified: Secondary | ICD-10-CM

## 2022-04-03 DIAGNOSIS — R052 Subacute cough: Secondary | ICD-10-CM

## 2022-04-03 DIAGNOSIS — F17200 Nicotine dependence, unspecified, uncomplicated: Secondary | ICD-10-CM

## 2022-04-03 MED ORDER — PROMETHAZINE-DM 6.25-15 MG/5ML PO SYRP: 5.0000 mL | ORAL_SOLUTION | Freq: Every evening | ORAL | 0 refills | Status: DC | PRN

## 2022-04-03 MED ORDER — BENZONATATE 100 MG PO CAPS: 100.0000 mg | ORAL_CAPSULE | Freq: Three times a day (TID) | ORAL | 0 refills | Status: DC | PRN

## 2022-04-03 MED ORDER — CETIRIZINE HCL 10 MG PO TABS: 10.0000 mg | ORAL_TABLET | Freq: Every day | ORAL | 3 refills | Status: AC

## 2022-04-03 MED ORDER — PREDNISONE 50 MG PO TABS: 50.0000 mg | ORAL_TABLET | Freq: Every day | ORAL | 0 refills | Status: DC

## 2022-04-03 NOTE — ED Provider Notes (Signed)
[place]   MRN: [id number] DOB: February 21, 1976  Subjective:   Daniel Hutchinson is a 46 y.o. male presenting for 1 day history of acute onset cough that elicits chest pain, shortness of breath and throat congestion.  Patient reports that the primary problem that he feels is throat congestion.  No fever, body aches, chest pain outside of the coughing.  No history of asthma but his parents do have a history of this.  He is a smoker, he has cut back to half pack per day.  Also works outdoors, does a lot of yard work, cutting down trees.  Does not take anything for allergies.  No chronic medications.    No Known Allergies  Past Medical History:  Diagnosis Date   Substance abuse ([place])      History reviewed. No pertinent surgical history.  Family History  Problem Relation Age of Onset   Diabetes Mother    Cancer Father    Heart failure Father     Social History   Tobacco Use   Smoking status: Every Day    Packs/day: 0.50    Types: Cigarettes   Smokeless tobacco: Never  Vaping Use   Vaping Use: Never used  Substance Use Topics   Alcohol use: Yes    Comment: occ   Drug use: Not Currently    Types: Methamphetamines, Marijuana    Comment: d/c use approx 3 years    ROS   Objective:   Vitals: BP 120/78 (BP Location: Right Arm)   Pulse 79   Temp 97.8 F (36.6 C) (Oral)   Resp 16   SpO2 96%   Physical Exam Constitutional:      General: He is not in acute distress.    Appearance: Normal appearance. He is well-developed and normal weight. He is not ill-appearing, toxic-appearing or diaphoretic.  HENT:     Head: Normocephalic and atraumatic.     Right Ear: External ear normal.     Left Ear: External ear normal.     Nose: Nose normal.     Mouth/Throat:     Mouth: Mucous membranes are moist.     Pharynx: No pharyngeal swelling, oropharyngeal exudate, posterior oropharyngeal erythema or uvula swelling.     Tonsils: No tonsillar exudate or  tonsillar abscesses. 0 on the right. 0 on the left.     Comments: Thick postnasal drainage overlying pharynx. Eyes:     General: No scleral icterus.       Right eye: No discharge.        Left eye: No discharge.     Extraocular Movements: Extraocular movements intact.  Cardiovascular:     Rate and Rhythm: Normal rate and regular rhythm.     Heart sounds: Normal heart sounds. No murmur heard.    No friction rub. No gallop.  Pulmonary:     Effort: Pulmonary effort is normal. No respiratory distress.     Breath sounds: Normal breath sounds. No stridor. No wheezing, rhonchi or rales.  Musculoskeletal:     Cervical back: Normal range of motion.  Skin:    General: Skin is warm and dry.  Neurological:     Mental Status: He is alert and oriented to person, place, and time.  Psychiatric:        Mood and Affect: Mood normal.        Behavior: Behavior normal.        Thought Content: Thought content normal.  Judgment: Judgment normal.     Assessment and Plan :   PDMP not reviewed this encounter.  1. Acute bronchitis, unspecified organism   2. Subacute cough   3. Smoker    Deferred imaging given clear cardiopulmonary exam, hemodynamically stable vital signs. Recommended oral prednisone course for management of his acute bronchitis especially in context of his smoking, working outdoors.  Make sure to start Zyrtec daily.  Counseled patient on potential for adverse effects with medications prescribed/recommended today, ER and return-to-clinic precautions discussed, patient verbalized understanding.    Jaynee Eagles, [place] 04/03/22 1111

## 2022-04-03 NOTE — ED Triage Notes (Addendum)
Pt c/o cough and chest discomfort x 1 day.

## 2022-04-03 NOTE — Discharge Instructions (Signed)
I have examined you and you do not have any signs of scabies or lice.

## 2022-04-06 ENCOUNTER — Ambulatory Visit
Admission: EM | Admit: 2022-04-06 | Discharge: 2022-04-06 | Disposition: A | Discharge status: Home / Self Care | Payer: Medicaid Other | Attending: Urgent Care | Admitting: Urgent Care

## 2022-04-06 ENCOUNTER — Encounter: Payer: Self-pay | Admitting: Emergency Medicine

## 2022-04-06 ENCOUNTER — Ambulatory Visit (INDEPENDENT_AMBULATORY_CARE_PROVIDER_SITE_OTHER): Payer: Self-pay

## 2022-04-06 DIAGNOSIS — R059 Cough, unspecified: Secondary | ICD-10-CM

## 2022-04-06 DIAGNOSIS — J019 Acute sinusitis, unspecified: Secondary | ICD-10-CM

## 2022-04-06 DIAGNOSIS — F17200 Nicotine dependence, unspecified, uncomplicated: Secondary | ICD-10-CM

## 2022-04-06 DIAGNOSIS — R051 Acute cough: Secondary | ICD-10-CM

## 2022-04-06 DIAGNOSIS — R0602 Shortness of breath: Secondary | ICD-10-CM

## 2022-04-06 MED ORDER — AMOXICILLIN-POT CLAVULANATE 875-125 MG PO TABS: 1.0000 | ORAL_TABLET | Freq: Two times a day (BID) | ORAL | 0 refills | Status: DC

## 2022-04-06 NOTE — ED Triage Notes (Signed)
Pt here for follow-up from the weekend and has worsening cough, bilateral eye irritation and scratchy throat.

## 2022-04-06 NOTE — ED Provider Notes (Signed)
[facility]   MRN: [id number] DOB: [birth year]  Subjective:   Daniel Hutchinson is a 46 y.o. male presenting for recheck on persistent and worsening cough, sinus congestion, bilateral eye irritation and scratchy throat.  Patient was last seen 04/03/2022.  Diagnosis was acute bronchitis and was started on an oral prednisone course.  Was also recommended to use supportive care.  Smokes 1/2ppd.  He has used all medications as prescribed and reports that the cough medicine helped really well with the cough but his sinuses seem to have worsened significantly.  No overt chest pain or shortness of breath, wheezing.  Does not need refills on medications.  No current facility-administered medications for this encounter.  Current Outpatient Medications:    benzonatate (TESSALON) 100 MG capsule, Take 1-2 capsules (100-200 mg total) by mouth 3 (three) times daily as needed for cough., Disp: 60 capsule, Rfl: 0   cetirizine (ZYRTEC ALLERGY) 10 MG tablet, Take 1 tablet (10 mg total) by mouth daily., Disp: 90 tablet, Rfl: 3   predniSONE (DELTASONE) 50 MG tablet, Take 1 tablet (50 mg total) by mouth daily with breakfast., Disp: 5 tablet, Rfl: 0   promethazine-dextromethorphan (PROMETHAZINE-DM) 6.25-15 MG/5ML syrup, Take 5 mLs by mouth at bedtime as needed for cough., Disp: 100 mL, Rfl: 0   No Known Allergies  Past Medical History:  Diagnosis Date   Substance abuse ([facility])      No past surgical history on file.  Family History  Problem Relation Age of Onset   Diabetes Mother    Cancer Father    Heart failure Father     Social History   Tobacco Use   Smoking status: Every Day    Packs/day: 0.50    Types: Cigarettes   Smokeless tobacco: Never  Vaping Use   Vaping Use: Never used  Substance Use Topics   Alcohol use: Yes    Comment: occ   Drug use: Not Currently    Types: Methamphetamines, Marijuana    Comment: d/c use approx 3 years    ROS   Objective:    Vitals: BP 126/78   Pulse 64   Temp 98.6 F (37 C) (Oral)   Resp (!) 22   SpO2 96%   Physical Exam Constitutional:      General: He is not in acute distress.    Appearance: Normal appearance. He is well-developed and normal weight. He is not ill-appearing, toxic-appearing or diaphoretic.  HENT:     Head: Normocephalic and atraumatic.     Right Ear: Tympanic membrane, ear canal and external ear normal. There is no impacted cerumen.     Left Ear: Tympanic membrane, ear canal and external ear normal. There is no impacted cerumen.     Nose: Congestion and rhinorrhea present.     Mouth/Throat:     Mouth: Mucous membranes are moist.     Pharynx: No oropharyngeal exudate or posterior oropharyngeal erythema.     Comments: Thick post-nasal drainage overlying pharynx.  Eyes:     General: No scleral icterus.       Right eye: No discharge.        Left eye: No discharge.     Extraocular Movements: Extraocular movements intact.     Conjunctiva/sclera: Conjunctivae normal.  Cardiovascular:     Rate and Rhythm: Normal rate and regular rhythm.     Heart sounds: Normal heart sounds. No murmur heard.    No friction rub. No gallop.  Pulmonary:  Effort: Pulmonary effort is normal. No respiratory distress.     Breath sounds: Normal breath sounds. No stridor. No wheezing, rhonchi or rales.  Musculoskeletal:     Cervical back: Normal range of motion and neck supple. No rigidity. No muscular tenderness.  Neurological:     General: No focal deficit present.     Mental Status: He is alert and oriented to person, place, and time.  Psychiatric:        Mood and Affect: Mood normal.        Behavior: Behavior normal.        Thought Content: Thought content normal.     Assessment and Plan :   PDMP not reviewed this encounter.  1. Acute sinusitis, recurrence not specified, unspecified location   2. Acute cough   3. Smoker    X-ray over-read was pending at time of discharge, recommended  follow up with only abnormal results. Otherwise will not call for negative over-read. Patient was in agreement. Will start empiric treatment for sinusitis with Augmentin.  Finish out the steroid course.  Recommended supportive care otherwise including the use of Zyrtec and cough medications. Counseled patient on potential for adverse effects with medications prescribed/recommended today, ER and return-to-clinic precautions discussed, patient verbalized understanding.    Wallis Bamberg, PA-C 04/06/22 1758

## 2022-06-17 ENCOUNTER — Ambulatory Visit
Admission: EM | Admit: 2022-06-17 | Discharge: 2022-06-17 | Disposition: A | Discharge status: Home / Self Care | Payer: Medicaid Other | Attending: Emergency Medicine | Admitting: Emergency Medicine

## 2022-06-17 DIAGNOSIS — L209 Atopic dermatitis, unspecified: Secondary | ICD-10-CM

## 2022-06-17 DIAGNOSIS — Z207 Contact with and (suspected) exposure to pediculosis, acariasis and other infestations: Secondary | ICD-10-CM

## 2022-06-17 MED ORDER — PERMETHRIN 5 % EX CREA: TOPICAL_CREAM | CUTANEOUS | 1 refills | Status: AC

## 2022-06-17 MED ORDER — IVERMECTIN 3 MG PO TABS: 200.0000 ug/kg | ORAL_TABLET | ORAL | 0 refills | Status: AC

## 2022-06-17 MED ORDER — MONTELUKAST SODIUM 10 MG PO TABS: 10.0000 mg | ORAL_TABLET | Freq: Every day | ORAL | 2 refills | Status: AC

## 2022-06-17 NOTE — Discharge Instructions (Signed)
This evening, please take 6 tablets of ivermectin and use the permethrin cream treatment as prescribed.  Please repeat dose in 2 weeks.  I noticed that you are currently taking Zyrtec every day and I am assuming this is because of your allergy to poison ivy.  I have also provided you with a prescription for Singulair to go with your Zyrtec as this will provide you better coverage when you are exposed to poison ivy.  Thank you for visiting urgent care today.

## 2022-06-17 NOTE — ED Triage Notes (Signed)
The patient states he was exposed to poison oak and wants to be tested for "crabs".

## 2022-06-17 NOTE — ED Provider Notes (Signed)
[facility]    CSN: [id number] Arrival date & time: 06/17/22  1004    HISTORY   Chief Complaint  Patient presents with   Rash   HPI Daniel Hutchinson is a pleasant, 46 y.o. male who presents to urgent care today. Patient is here with his male partner.  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  Patient states his male partner is complaining about frequent breakout of rash, particularly after they have sexual intercourse.  Patient states he wants to be tested for Crohn's.  Patient denies itching or rash in genital area.  Patient is here with his male partner today.  Male partner states she believes that she was exposed to scabies as well.  The history is provided by the patient.   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 No pertinent surgical history.  Home Medications    Prior to Admission medications   Medication Sig Start Date End Date Taking? Authorizing Provider  cetirizine (ZYRTEC ALLERGY) 10 MG tablet Take 1 tablet (10 mg total) by mouth daily. 04/03/22   Jaynee Eagles, PA-C    Family History Family History  Problem Relation Age of Onset   Diabetes Mother    Cancer Father    Heart failure Father    Social History Social History   Tobacco Use   Smoking status: Every Day    Packs/day: 0.50    Types: Cigarettes   Smokeless tobacco: Never  Vaping Use   Vaping Use: Never used  Substance Use Topics   Alcohol use: Yes    Comment: occ   Drug use: Not Currently    Types:  Methamphetamines, Marijuana    Comment: d/c use approx 3 years   Allergies   Patient has no known allergies.  Review of Systems Review of Systems Pertinent findings revealed after performing a 14 point review of systems has been noted in the history of present illness.  Physical Exam Triage Vital Signs ED Triage Vitals  Enc Vitals Group     BP 08/20/21 0827 (!) 147/82     Pulse Rate 08/20/21 0827 72     Resp 08/20/21 0827 18     Temp 08/20/21 0827 98.3 F (36.8 C)     Temp Source 08/20/21 0827 Oral     SpO2 08/20/21 0827 98 %     Weight --      Height --      Head Circumference --      Peak Flow --      Pain Score 08/20/21 0826 5     Pain Loc --      Pain Edu? --      Excl. in [place]? --   No data found.  Updated Vital Signs BP 124/79 (BP Location: Right Arm)   Pulse 89   Temp 98.1 F (36.7 C) (Oral)   Resp 18   Wt 206 lb 3.2 oz (93.5 kg)   SpO2 94%   BMI 27.97 kg/m   Physical Exam Vitals and nursing note reviewed. Exam conducted with a  chaperone present.  Constitutional:      General: He is not in acute distress.    Appearance: Normal appearance. He is normal weight. He is not ill-appearing.  HENT:     Head: Normocephalic and atraumatic.  Eyes:     Extraocular Movements: Extraocular movements intact.     Conjunctiva/sclera: Conjunctivae normal.     Pupils: Pupils are equal, round, and reactive to light.  Cardiovascular:     Rate and Rhythm: Normal rate and regular rhythm.  Pulmonary:     Effort: Pulmonary effort is normal.     Breath sounds: Normal breath sounds.  Genitourinary:    Pubic Area: No rash or pubic lice.      Penis: Normal and circumcised.      Testes: Normal.     Prostate: Normal.  Musculoskeletal:        General: Normal range of motion.     Cervical back: Normal range of motion and neck supple.  Skin:    General: Skin is warm and dry.     Findings: Rash (Mild atopic dermatitis appreciated across lower abdomen without signs of excoriation,  blistering or drainage.) present.  Neurological:     General: No focal deficit present.     Mental Status: He is alert and oriented to person, place, and time. Mental status is at baseline.  Psychiatric:        Mood and Affect: Mood normal.        Behavior: Behavior normal.        Thought Content: Thought content normal.        Judgment: Judgment normal.     Visual Acuity Right Eye Distance:   Left Eye Distance:   Bilateral Distance:    Right Eye Near:   Left Eye Near:    Bilateral Near:     UC Couse / Diagnostics / Procedures:     Radiology No results found.  Procedures Procedures (including critical care time) EKG  Pending results:  Labs Reviewed - No data to display  Medications Ordered in UC: Medications - No data to display  UC Diagnoses / Final Clinical Impressions(s)   I have reviewed the triage vital signs and the nursing notes.  Pertinent labs & imaging results that were available during my care of the patient were reviewed by me and considered in my medical decision making (see chart for details).    Final diagnoses:  Atopic dermatitis, unspecified type  Exposure to scabies   Patient currently taking Zyrtec daily for poison ivy exposure prevention, will add Singulair for better coverage.  Patient states he washes daily with Ivarest as well, recommend that he continue doing that.  We will treat patient empirically for possible scabies exposure oral and topical treatment.  Return precautions advised.  ED Prescriptions     Medication Sig Dispense Auth. Provider   montelukast (SINGULAIR) 10 MG tablet Take 1 tablet (10 mg total) by mouth at bedtime. 30 tablet Theadora Rama Scales, PA-C   permethrin (ELIMITE) 5 % cream Apply to affected area after evening shower, apply to skin from chin to toe avoiding genital area.  Allow cream to remain in place overnight, shower and wash skin with soap and water to remove cream.  Repeat this treatment in 14 days. 60 g  Theadora Rama Scales, PA-C   ivermectin (STROMECTOL) 3 MG TABS tablet Take 6 tablets (18,000 mcg total) by mouth every 14 (fourteen) days for 2 doses. Take first dose today.  Take second dose 2 weeks  from today. 12 tablet Theadora Rama Scales, PA-C      PDMP not reviewed this encounter.  Pending results:  Labs Reviewed - No data to display  Discharge Instructions:   Discharge Instructions      This evening, please take 6 tablets of ivermectin and use the permethrin cream treatment as prescribed.  Please repeat dose in 2 weeks.  I noticed that you are currently taking Zyrtec every day and I am assuming this is because of your allergy to poison ivy.  I have also provided you with a prescription for Singulair to go with your Zyrtec as this will provide you better coverage when you are exposed to poison ivy.  Thank you for visiting urgent care today.      Disposition Upon Discharge:  Condition: stable for discharge home  Patient presented with an acute illness with associated systemic symptoms and significant discomfort requiring urgent management. In my opinion, this is a condition that a prudent lay person (someone who possesses an average knowledge of health and medicine) may potentially expect to result in complications if not addressed urgently such as respiratory distress, impairment of bodily function or dysfunction of bodily organs.   Routine symptom specific, illness specific and/or disease specific instructions were discussed with the patient and/or caregiver at length.   As such, the patient has been evaluated and assessed, work-up was performed and treatment was provided in alignment with urgent care protocols and evidence based medicine.  Patient/parent/caregiver has been advised that the patient may require follow up for further testing and treatment if the symptoms continue in spite of treatment, as clinically indicated and appropriate.  Patient/parent/caregiver has  been advised to return to the [facility] or PCP if no better; to PCP or the Emergency Department if new signs and symptoms develop, or if the current signs or symptoms continue to change or worsen for further workup, evaluation and treatment as clinically indicated and appropriate  The patient will follow up with their current PCP if and as advised. If the patient does not currently have a PCP we will assist them in obtaining one.   The patient may need specialty follow up if the symptoms continue, in spite of conservative treatment and management, for further workup, evaluation, consultation and treatment as clinically indicated and appropriate.   Patient/parent/caregiver verbalized understanding and agreement of plan as discussed.  All questions were addressed during visit.  Please see discharge instructions below for further details of plan.  This office note has been dictated using Teaching laboratory technician.  Unfortunately, this method of dictation can sometimes lead to typographical or grammatical errors.  I apologize for your inconvenience in advance if this occurs.  Please do not hesitate to reach out to me if clarification is needed.      Theadora Rama Scales, PA-C 06/17/22 1334

## 2022-09-13 ENCOUNTER — Ambulatory Visit
Admission: EM | Admit: 2022-09-13 | Discharge: 2022-09-13 | Disposition: A | Discharge status: Home / Self Care | Payer: Medicaid Other | Attending: Urgent Care | Admitting: Urgent Care

## 2022-09-13 ENCOUNTER — Ambulatory Visit (INDEPENDENT_AMBULATORY_CARE_PROVIDER_SITE_OTHER): Payer: Self-pay

## 2022-09-13 DIAGNOSIS — B351 Tinea unguium: Secondary | ICD-10-CM

## 2022-09-13 DIAGNOSIS — L02612 Cutaneous abscess of left foot: Secondary | ICD-10-CM

## 2022-09-13 DIAGNOSIS — Z23 Encounter for immunization: Secondary | ICD-10-CM

## 2022-09-13 DIAGNOSIS — M79672 Pain in left foot: Secondary | ICD-10-CM

## 2022-09-13 MED ORDER — NAPROXEN 500 MG PO TABS: 500.0000 mg | ORAL_TABLET | Freq: Two times a day (BID) | ORAL | 0 refills | Status: AC

## 2022-09-13 MED ORDER — DOXYCYCLINE HYCLATE 100 MG PO CAPS: 100.0000 mg | ORAL_CAPSULE | Freq: Two times a day (BID) | ORAL | 0 refills | Status: AC

## 2022-09-13 MED ORDER — FLUCONAZOLE 150 MG PO TABS: 150.0000 mg | ORAL_TABLET | ORAL | 0 refills | Status: AC

## 2022-09-13 MED ORDER — TETANUS-DIPHTH-ACELL PERTUSSIS 5-2.5-18.5 LF-MCG/0.5 IM SUSY: 0.5000 mL | PREFILLED_SYRINGE | Freq: Once | INTRAMUSCULAR | Status: DC

## 2022-09-13 NOTE — Discharge Instructions (Signed)
Please change your dressing 3-5 times daily. Do not apply any ointments or creams. Each time you change your dressing, make sure that you are pressing on the wound to get pus to come out.  Try your best to clean the wound with antibacterial soap and warm water. Pat your wound dry and let it air out if possible to make sure it is dry before reapplying another dressing.   Start doxycycline for the infection. Use naproxne for the pain.

## 2022-09-13 NOTE — ED Triage Notes (Signed)
Pt states he injured left foot when he jumped off of a truck ~6 weeks ago and landed on board with nails-NAD-limping gait

## 2022-09-13 NOTE — ED Provider Notes (Signed)
[facility]  Note:  This document was prepared using Conservation officer, historic buildings and may include unintentional dictation errors.  MRN: [id number] DOB: 07-Jul-1976  Subjective:   Daniel Hutchinson is a 46 y.o. male presenting for 1 week history of persistent and worsening left foot pain on the plantar surface distally.  Patient suffered an injury about 6 weeks ago and landed on a board that had nails on it.  He had a puncture wound then and has been trying to attend to his wound carefully daily.  Unfortunately he has progressively worsened and dramatically so in the past week.  Tdap was updated in the past 5 years.  No current facility-administered medications for this encounter.  Current Outpatient Medications:    cetirizine (ZYRTEC ALLERGY) 10 MG tablet, Take 1 tablet (10 mg total) by mouth daily., Disp: 90 tablet, Rfl: 3   montelukast (SINGULAIR) 10 MG tablet, Take 1 tablet (10 mg total) by mouth at bedtime., Disp: 30 tablet, Rfl: 2   permethrin (ELIMITE) 5 % cream, Apply to affected area after evening shower, apply to skin from chin to toe avoiding genital area.  Allow cream to remain in place overnight, shower and wash skin with soap and water to remove cream.  Repeat this treatment in 14 days., Disp: 60 g, Rfl: 1   No Known Allergies  Past Medical History:  Diagnosis Date   Substance abuse ([facility])      History reviewed. No pertinent surgical history.  Family History  Problem Relation Age of Onset   Diabetes Mother    Cancer Father    Heart failure Father     Social History   Tobacco Use   Smoking status: Every Day    Packs/day: 0.50    Types: Cigarettes   Smokeless tobacco: Never  Vaping Use   Vaping Use: Never used  Substance Use Topics   Alcohol use: Yes    Comment: occ   Drug use: Not Currently    Types: Methamphetamines, Marijuana    ROS   Objective:   Vitals: BP 138/83 (BP Location: Right Arm)   Pulse 82   Temp 98.3 F  (36.8 C) (Oral)   Resp 20   SpO2 98%   Physical Exam Constitutional:      General: He is not in acute distress.    Appearance: Normal appearance. He is well-developed and normal weight. He is not ill-appearing, toxic-appearing or diaphoretic.  HENT:     Head: Normocephalic and atraumatic.     Right Ear: External ear normal.     Left Ear: External ear normal.     Nose: Nose normal.     Mouth/Throat:     Pharynx: Oropharynx is clear.  Eyes:     General: No scleral icterus.       Right eye: No discharge.        Left eye: No discharge.     Extraocular Movements: Extraocular movements intact.  Cardiovascular:     Rate and Rhythm: Normal rate.  Pulmonary:     Effort: Pulmonary effort is normal.  Musculoskeletal:     Cervical back: Normal range of motion.       Feet:  Neurological:     Mental Status: He is alert and oriented to person, place, and time.  Psychiatric:        Mood and Affect: Mood normal.        Behavior: Behavior normal.        Thought Content:  Thought content normal.        Judgment: Judgment normal.     DG Foot Complete Left  Result Date: 09/13/2022 CLINICAL DATA:  Left foot pain EXAM: LEFT FOOT - COMPLETE 3+ VIEW COMPARISON:  None Available. FINDINGS: There is no evidence of acute fracture. Alignment is normal. No significant degenerative change. No bony erosion. No periostitis. No evidence of radiopaque foreign body. IMPRESSION: No acute osseous abnormality. No evidence of radiopaque foreign body. Electronically Signed   By: Caprice Renshaw M.D.   On: 09/13/2022 14:25    PROCEDURE NOTE: I&D of Abscess Verbal consent obtained. Local anesthesia with 2cc of 2% lidocaine with epinephrine. Site cleansed with iodine, wound cleanser. Incision of 1/2cm was made using an 11 blade, 3cc expressed consisting of a mixture of pus and serosanguinous fluid. Wound cavity was explored with curved hemostats and loculations loosened. Cleansed and dressed.   Assessment and Plan :    PDMP not reviewed this encounter.  1. Foot abscess, left   2. Need for diphtheria-tetanus-pertussis (Tdap) vaccine     Successful I&D performed.  Wound care reviewed.  Start doxycycline for the abscess, ibuprofen for pain and inflammation. Counseled patient on potential for adverse effects with medications prescribed/recommended today, ER and return-to-clinic precautions discussed, patient verbalized understanding.   At discharge, patient also requesting medication to help him with fungal infection of his toes.  On exam, patient does hyperkeratotic toenails throughout worse over the right foot.  Recommended oral fluconazole once weekly for the next 6 weeks.   Wallis Bamberg, PA-C 09/13/22 1715

## 2022-11-05 IMAGING — DX DG CHEST 2V
2 series · 2 of 2 positions shown · non-contrast
Comparison: 09/23/2020

CLINICAL DATA: Cough, shortness of breath

EXAM:
CHEST - 2 VIEW

[chest pa]
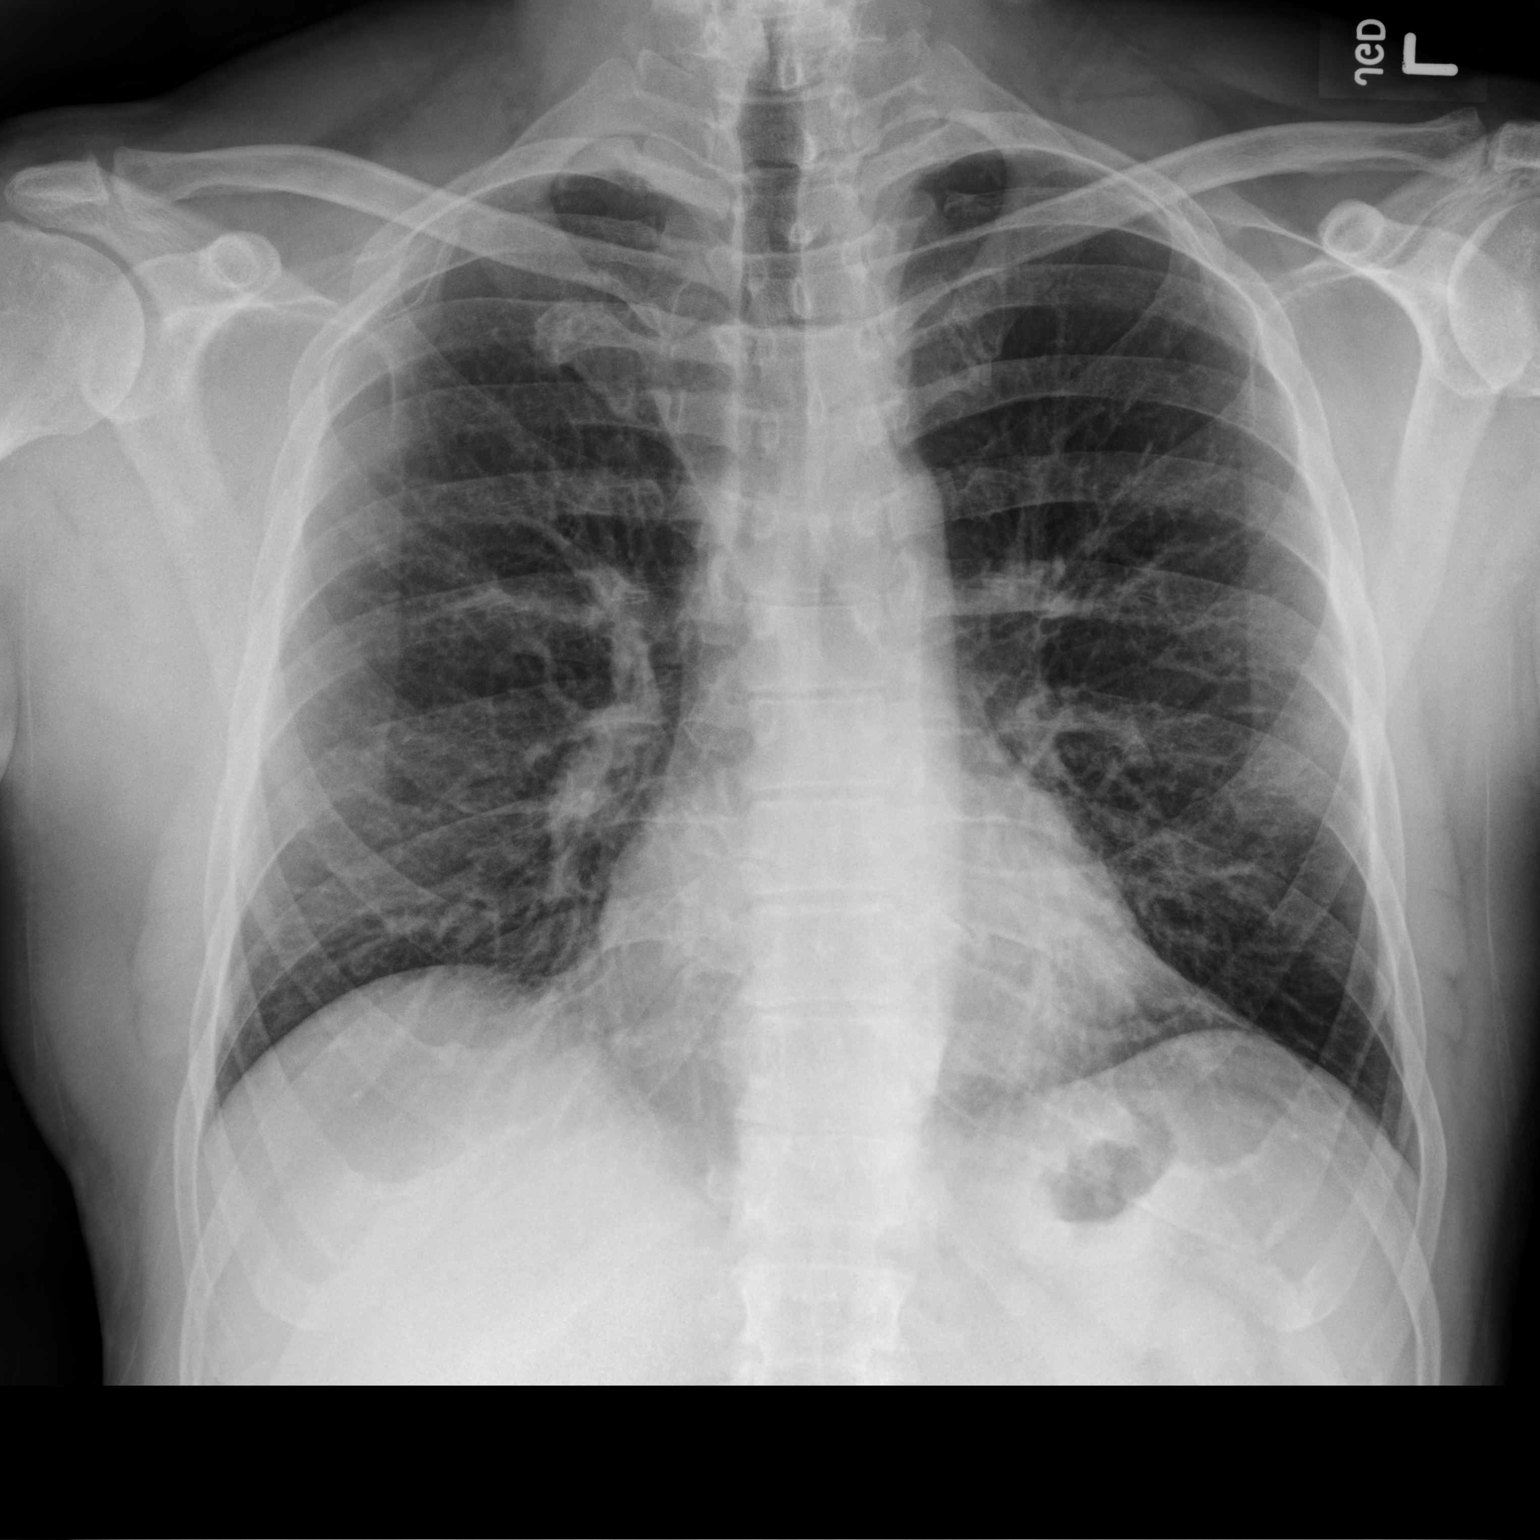

[chest lat]
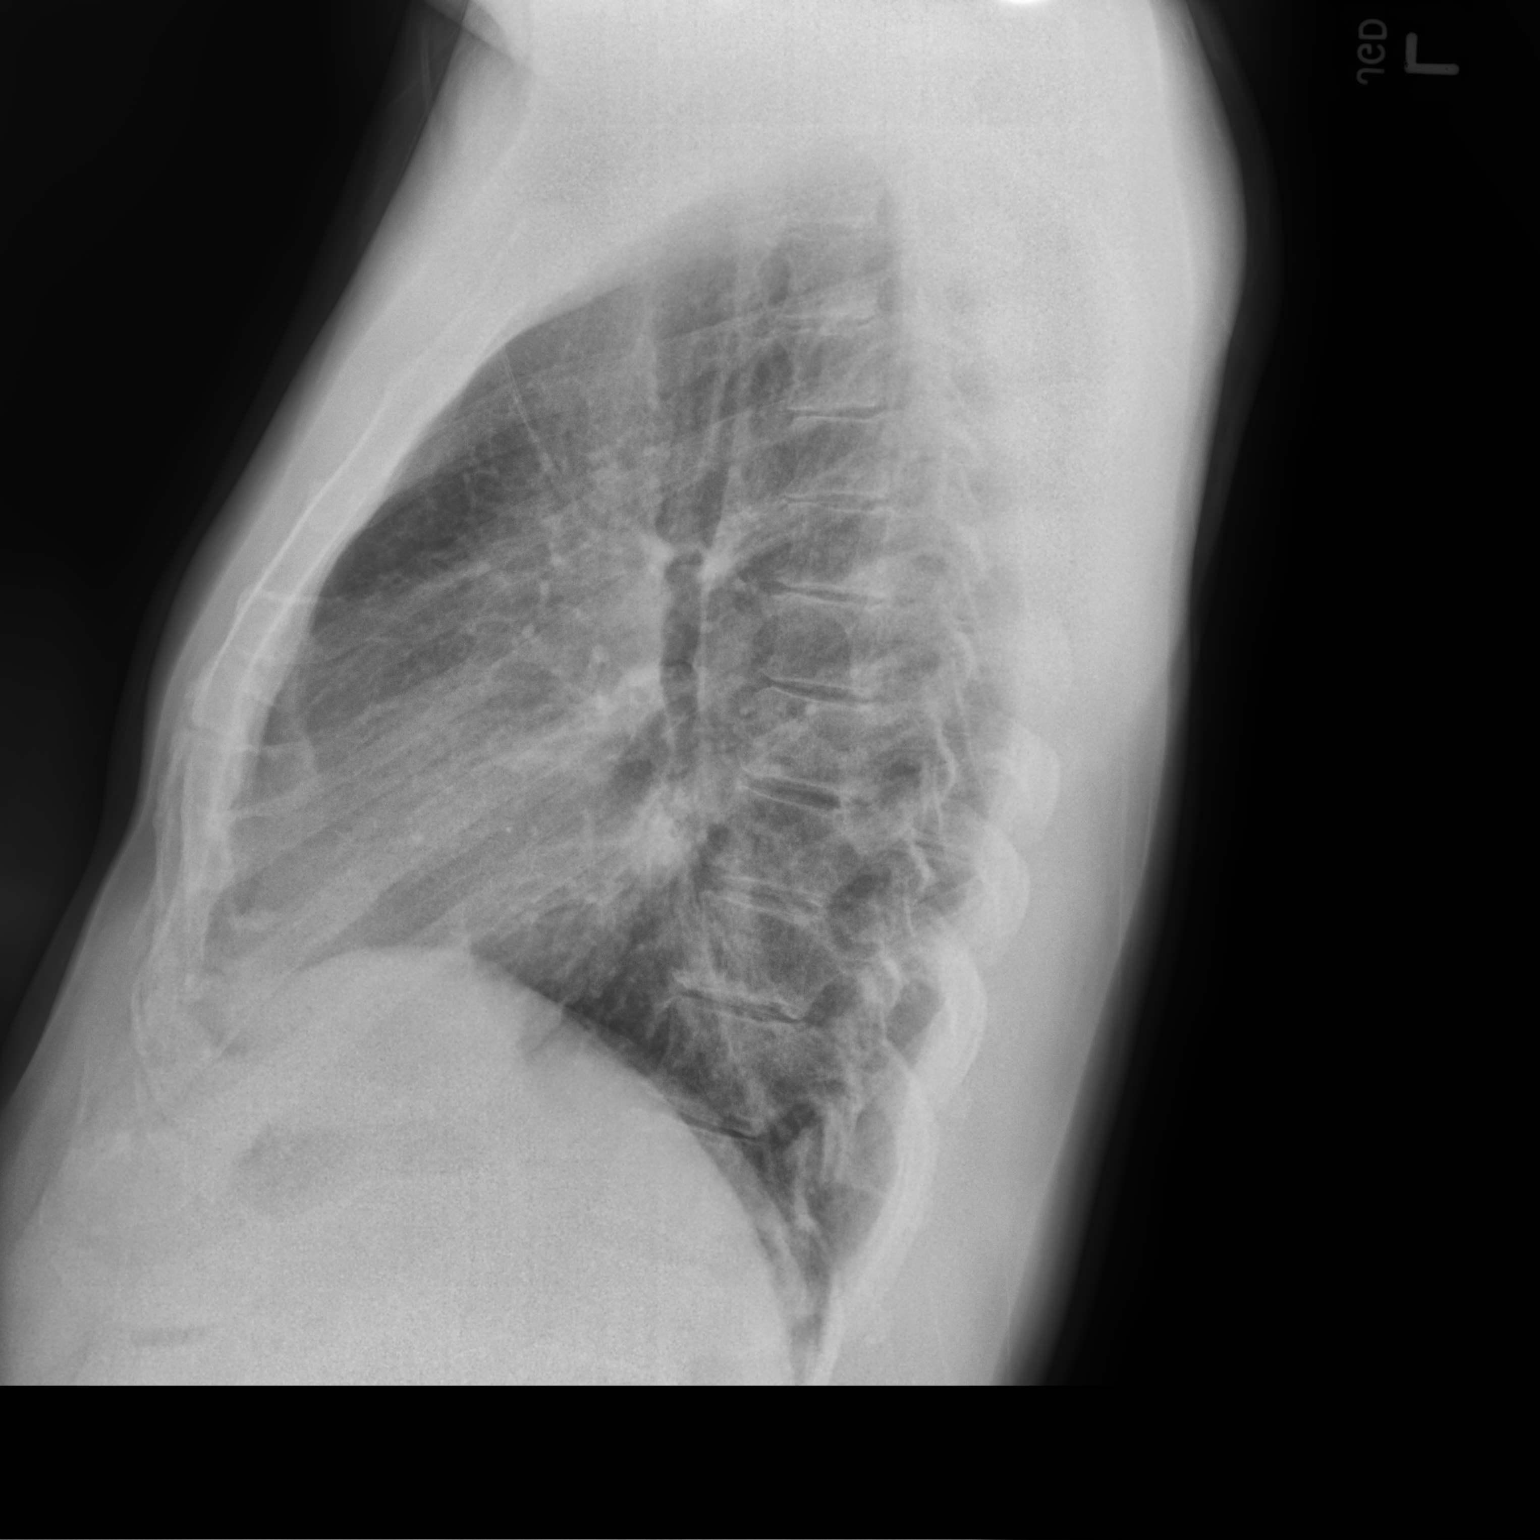

[2 of 2 positions shown; findings below may reference images not displayed]

FINDINGS: The heart size and mediastinal contours are within normal limits.
Mild diffuse bilateral interstitial opacity. Disc degenerative
disease of the thoracic spine.
IMPRESSION: Mild diffuse bilateral interstitial opacity, may reflect edema or
atypical/viral infection. No focal airspace opacity.

## 2024-03-11 ENCOUNTER — Ambulatory Visit
Admission: EM | Admit: 2024-03-11 | Discharge: 2024-03-11 | Disposition: A | Discharge status: Home / Self Care | Payer: Self-pay | Attending: Family Medicine | Admitting: Family Medicine

## 2024-03-11 DIAGNOSIS — F1510 Other stimulant abuse, uncomplicated: Secondary | ICD-10-CM

## 2024-03-11 DIAGNOSIS — Z711 Person with feared health complaint in whom no diagnosis is made: Secondary | ICD-10-CM

## 2024-03-11 NOTE — ED Triage Notes (Signed)
 Pt states he has some skin issues r/t to using meth-states he is here with SO because she wants him "to get checked out because she feels I'm giving her something"-NAD-steady gait

## 2024-03-11 NOTE — Discharge Instructions (Signed)
 I do not see any particular rash that is infectious or contagious.

## 2024-03-11 NOTE — ED Provider Notes (Signed)
 [facility]  Note:  This document was prepared using Conservation officer, historic buildings and may include unintentional dictation errors.  MRN: [id number] DOB: 09-11-1976  Subjective:   Daniel Hutchinson is a 48 y.o. male presenting for evaluation for scabies.  Presents with his male partner who according to both of them, has significant concerns that the patient has a scabies infestation.  The patient himself denies any particular itching, burrows.  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  Has been managed for this in the past and has bouts of this.  No other rashes present.  Patient does live in his own home.  Also reports that he does use crystal methamphetamine.  His girlfriend also uses with him.  No current facility-administered medications for this encounter.  Current Outpatient Medications:    cetirizine  (ZYRTEC  ALLERGY) 10 MG tablet, Take 1 tablet (10 mg total) by mouth daily., Disp: 90 tablet, Rfl: 3   doxycycline  (VIBRAMYCIN ) 100 MG capsule, Take 1 capsule (100 mg total) by mouth 2 (two) times daily., Disp: 20 capsule, Rfl: 0   fluconazole  (DIFLUCAN ) 150 MG tablet, Take 1 tablet (150 mg total) by mouth once a week., Disp: 6 tablet, Rfl: 0   montelukast  (SINGULAIR ) 10 MG tablet, Take 1 tablet (10 mg total) by mouth at bedtime., Disp: 30 tablet, Rfl: 2   naproxen  (NAPROSYN ) 500 MG tablet, Take 1 tablet (500 mg total) by mouth 2 (two) times daily with a meal., Disp: 30 tablet, Rfl: 0   permethrin  (ELIMITE ) 5 % cream, Apply to affected area after evening shower, apply to skin from chin to toe avoiding genital area.  Allow cream to remain in place overnight, shower and wash skin with soap and water to remove cream.  Repeat this treatment in 14 days., Disp: 60 g, Rfl: 1   No Known Allergies  Past Medical History:  Diagnosis Date   Substance abuse (HCC)      History reviewed. No pertinent surgical history.  Family History   Problem Relation Age of Onset   Diabetes Mother    Cancer Father    Heart failure Father     Social History   Tobacco Use   Smoking status: Every Day    Current packs/day: 0.50    Types: Cigarettes   Smokeless tobacco: Never  Vaping Use   Vaping status: Never Used  Substance Use Topics   Alcohol use: Yes    Comment: weekly   Drug use: Yes    Types: Methamphetamines, Marijuana    ROS   Objective:   Vitals: BP (!) 149/79 (BP Location: Left Arm)   Pulse (!) 110   Temp 98.4 F (36.9 C) (Oral)   Resp 20   SpO2 95%   Physical Exam Constitutional:      General: He is not in acute distress.    Appearance: Normal appearance. He is well-developed and normal weight. He is not ill-appearing, toxic-appearing or diaphoretic.  HENT:     Head: Normocephalic and atraumatic.     Right Ear: External ear normal.     Left Ear: External ear normal.     Nose: Nose normal.     Mouth/Throat:     Pharynx: Oropharynx is clear.  Eyes:     General: No scleral icterus.       Right eye: No discharge.        Left eye: No discharge.     Extraocular Movements: Extraocular movements intact.  Cardiovascular:     Rate and Rhythm: Normal rate.  Pulmonary:     Effort: Pulmonary effort is normal.  Musculoskeletal:     Cervical back: Normal range of motion.  Skin:    Findings: Rash (patches of folliculitis over the thighs bilaterally) present.  Neurological:     Mental Status: He is alert and oriented to person, place, and time.  Psychiatric:        Mood and Affect: Mood normal.        Behavior: Behavior normal.        Thought Content: Thought content normal.        Judgment: Judgment normal.     Assessment and Plan :   PDMP not reviewed this encounter.  1. Feared condition not demonstrated   2. Methamphetamine use (HCC)    No appreciable rash demonstrating scabies infestation.  I saw the patient and his girlfriend together, this issue is threatening the relationship.  We had an  extensive discussion about the differential and ultimately, advised that the 2 of them see the same dermatologist.  I offered empiric treatment for the patient but he declined.   Adolph Hoop, [place] 03/12/24 [phone]

## 2024-09-11 ENCOUNTER — Other Ambulatory Visit: Payer: Self-pay

## 2024-09-11 ENCOUNTER — Ambulatory Visit
Admission: EM | Admit: 2024-09-11 | Discharge: 2024-09-11 | Disposition: A | Discharge status: Home / Self Care | Payer: Self-pay | Attending: Nurse Practitioner | Admitting: Nurse Practitioner

## 2024-09-11 DIAGNOSIS — S0502XA Injury of conjunctiva and corneal abrasion without foreign body, left eye, initial encounter: Secondary | ICD-10-CM

## 2024-09-11 MED ORDER — OFLOXACIN 0.3 % OP SOLN: 1.0000 [drp] | Freq: Four times a day (QID) | OPHTHALMIC | 0 refills | Status: AC

## 2024-09-11 NOTE — Discharge Instructions (Signed)
 Start antibiotic eyedrops as prescribed.  Please go to the emergency room if you develop any worsening symptoms such as worsening eye pain, visual changes, or any new concerns that arise.  I also recommend you follow-up with ophthalmology outpatient for reevaluation and eye exam.  Hope you feel better soon!

## 2024-09-11 NOTE — ED Triage Notes (Signed)
 Pt states he got a piece of wood in left eye approx 2 hours ago. Pt states he tried flushing his eye, but it didn't help . Pt states he can't keep eye open. Pt has 1+ swelling of let eye. Pt's sclera is erythematous

## 2024-09-11 NOTE — ED Provider Notes (Addendum)
 [facility]    CSN: [id number] Arrival date & time: 09/11/24  1548      History   Chief Complaint No chief complaint on file.   HPI Daniel Hutchinson is a 48 y.o. male presents for foreign body in body.  Patient states 2 hours prior to arrival he was chopping a piece of wood when he thinks a piece of wood got into his left eye.  He states he tried irrigating at home but it did not help.  Does not wear glasses or contacts but does state he probably needs to have his eyes checked.  Does endorse photophobia, tearing and foreign body sensation.  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 No pertinent surgical history.     Home Medications    Prior to Admission medications   Medication Sig Start Date End Date Taking? Authorizing Provider  ofloxacin (OCUFLOX) 0.3 % ophthalmic solution Place 1 drop into the left eye 4 (four) times daily for 5 days. 09/11/24 09/16/24 Yes Aran Menning, Jodi R, NP  cetirizine  (ZYRTEC  ALLERGY) 10 MG tablet Take 1 tablet (10 mg total) by mouth daily. 04/03/22   Christopher Savannah, PA-C  doxycycline  (VIBRAMYCIN ) 100 MG capsule Take 1 capsule (100 mg total) by mouth 2 (two) times daily. 09/13/22   Christopher Savannah, PA-C  fluconazole  (DIFLUCAN ) 150 MG tablet Take 1 tablet (150 mg total) by mouth once a week. 09/13/22   Christopher Savannah, PA-C  montelukast  (SINGULAIR ) 10 MG tablet Take 1 tablet (10 mg total) by mouth at bedtime. 06/17/22 09/15/22  Joesph Shaver Scales, PA-C  naproxen  (NAPROSYN ) 500 MG  tablet Take 1 tablet (500 mg total) by mouth 2 (two) times daily with a meal. 09/13/22   Christopher Savannah, PA-C  permethrin  (ELIMITE ) 5 % cream Apply to affected area after evening shower, apply to skin from chin to toe avoiding genital area.  Allow cream to remain in place overnight, shower and wash skin with soap and water to remove cream.  Repeat this treatment in 14 days. 06/17/22   Joesph Shaver Scales, PA-C  mirtazapine  (REMERON ) 15 MG tablet Take 1 tablet (15 mg total) by mouth at bedtime. 09/02/19 04/10/20  Wonda Clarita BRAVO, NP  traZODone  (DESYREL ) 50 MG tablet Take 1 tablet (50 mg total) by mouth at bedtime as needed for sleep. 09/02/19 04/10/20  Wonda Clarita BRAVO, NP    Family History Family History  Problem Relation Age of Onset   Diabetes Mother    Cancer Father    Heart failure Father     Social History Social History   Tobacco Use   Smoking status: Every Day    Current packs/day: 0.50    Types: Cigarettes   Smokeless tobacco: Never  Vaping Use   Vaping status: Never Used  Substance Use Topics   Alcohol use: Yes    Alcohol/week: 11.0 standard drinks of alcohol    Types: 5 Cans of beer, 6 Shots of liquor per week   Drug use: Yes    Types: Marijuana  Allergies   Patient has no known allergies.   Review of Systems Review of Systems  Eyes:  Positive for photophobia, pain and redness.     Physical Exam Triage Vital Signs ED Triage Vitals  Encounter Vitals Group     BP 09/11/24 1611 123/81     Girls Systolic BP Percentile --      Girls Diastolic BP Percentile --      Boys Systolic BP Percentile --      Boys Diastolic BP Percentile --      Pulse Rate 09/11/24 1611 75     Resp 09/11/24 1611 16     Temp 09/11/24 1611 98.6 F (37 C)     Temp Source 09/11/24 1611 Oral     SpO2 09/11/24 1611 95 %     Weight --      Height --      Head Circumference --      Peak Flow --      Pain Score 09/11/24 1609 8     Pain Loc --      Pain Education --      Exclude from  Growth Chart --    No data found.  Updated Vital Signs BP 123/81   Pulse 75   Temp 98.6 F (37 C) (Oral)   Resp 16   SpO2 95%   Visual Acuity Right Eye Distance: 20/25 Left Eye Distance: 20/40 Bilateral Distance:    Right Eye Near:   Left Eye Near:    Bilateral Near:     Physical Exam Vitals and nursing note reviewed.  Constitutional:      General: He is not in acute distress.    Appearance: Normal appearance. He is not ill-appearing.  HENT:     Head: Normocephalic and atraumatic.  Eyes:     General: Lids are normal.        Left eye: Discharge present.No foreign body or hordeolum.     Extraocular Movements: Extraocular movements intact.     Conjunctiva/sclera:     Left eye: Left conjunctiva is injected. No chemosis, exudate or hemorrhage.    Pupils: Pupils are equal, round, and reactive to light.     Left eye: Corneal abrasion and fluorescein uptake present.      Comments: Watery drainage from left conjunctiva with mild injection.  No visible foreign body.  Cardiovascular:     Rate and Rhythm: Normal rate.  Pulmonary:     Effort: Pulmonary effort is normal.  Skin:    General: Skin is warm and dry.  Neurological:     General: No focal deficit present.     Mental Status: He is alert and oriented to person, place, and time.  Psychiatric:        Mood and Affect: Mood normal.        Behavior: Behavior normal.     Visual Acuity Visual Acuity R Distance: 20/25L Distance: 20/40    UC Treatments / Results  Labs (all labs ordered are listed, but only abnormal results are displayed) Labs Reviewed - No data to display  EKG   Radiology No results found.  Procedures Procedures (including critical care time)  Medications Ordered in UC Medications - No data to display  Initial Impression / Assessment and Plan / UC Course  I have reviewed the triage vital signs and the nursing notes.  Pertinent labs & imaging results that were available during my care of  the patient were reviewed by me and considered in my  medical decision making (see chart for details).     Reviewed exam and symptoms with patient.  I irrigated by nursing staff.  Possible fluorescein uptake without visible foreign body.  Will do antibiotic eyedrops as prescribed with recommendation to follow-up with ophthalmology outpatient.  Strict ER precautions reviewed and patient verbalized understanding. Final Clinical Impressions(s) / UC Diagnoses   Final diagnoses:  Abrasion of left cornea, initial encounter     Discharge Instructions      Start antibiotic eyedrops as prescribed.  Please go to the emergency room if you develop any worsening symptoms such as worsening eye pain, visual changes, or any new concerns that arise.  I also recommend you follow-up with ophthalmology outpatient for reevaluation and eye exam.  Hope you feel better soon!     ED Prescriptions     Medication Sig Dispense Auth. Provider   ofloxacin (OCUFLOX) 0.3 % ophthalmic solution Place 1 drop into the left eye 4 (four) times daily for 5 days. 5 mL Preeti Winegardner, Jodi R, NP      PDMP not reviewed this encounter.   Loreda Myla SAUNDERS, NP 09/11/24 1646    Loreda Myla SAUNDERS, NP 09/11/24 [phone]
